# Patient Record
Sex: Female | Born: 1985 | Race: White | Hispanic: No
Health system: Southern US, Community
[De-identification: ages and names within clinical notes are randomized; demographics above are authoritative.]

## PROBLEM LIST (undated history)

## (undated) ENCOUNTER — Emergency Department (HOSPITAL_COMMUNITY): Admission: EM | Payer: Self-pay | Source: Home / Self Care

## (undated) DIAGNOSIS — R519 Headache, unspecified: Secondary | ICD-10-CM

## (undated) DIAGNOSIS — F419 Anxiety disorder, unspecified: Secondary | ICD-10-CM

## (undated) HISTORY — PX: NEPHRECTOMY: SHX65

---

## 2008-06-02 ENCOUNTER — Emergency Department (HOSPITAL_COMMUNITY): Admission: EM | Admit: 2008-06-02 | Discharge: 2008-06-02 | Payer: Self-pay | Admitting: Emergency Medicine

## 2011-01-27 ENCOUNTER — Emergency Department (HOSPITAL_COMMUNITY)
Admission: EM | Admit: 2011-01-27 | Discharge: 2011-01-27 | Disposition: A | Discharge status: Home / Self Care | Payer: Self-pay | Attending: Emergency Medicine | Admitting: Emergency Medicine

## 2011-01-27 DIAGNOSIS — K047 Periapical abscess without sinus: Secondary | ICD-10-CM | POA: Insufficient documentation

## 2011-02-13 ENCOUNTER — Emergency Department (HOSPITAL_COMMUNITY)
Admission: EM | Admit: 2011-02-13 | Discharge: 2011-02-13 | Disposition: A | Discharge status: Home / Self Care | Payer: Self-pay | Attending: Emergency Medicine | Admitting: Emergency Medicine

## 2011-02-13 DIAGNOSIS — H109 Unspecified conjunctivitis: Secondary | ICD-10-CM | POA: Insufficient documentation

## 2011-09-12 LAB — URINALYSIS, ROUTINE W REFLEX MICROSCOPIC
Leukocytes, UA: NEGATIVE
Nitrite: NEGATIVE
Urobilinogen, UA: 2 — ABNORMAL HIGH

## 2011-09-12 LAB — PREGNANCY, URINE: Preg Test, Ur: NEGATIVE

## 2011-10-27 ENCOUNTER — Emergency Department (HOSPITAL_COMMUNITY)
Admission: EM | Admit: 2011-10-27 | Discharge: 2011-10-27 | Disposition: A | Discharge status: Home / Self Care | Payer: Self-pay | Attending: Emergency Medicine | Admitting: Emergency Medicine

## 2011-10-27 ENCOUNTER — Emergency Department (HOSPITAL_COMMUNITY): Payer: Self-pay

## 2011-10-27 DIAGNOSIS — S29011A Strain of muscle and tendon of front wall of thorax, initial encounter: Secondary | ICD-10-CM

## 2011-10-27 DIAGNOSIS — R079 Chest pain, unspecified: Secondary | ICD-10-CM | POA: Insufficient documentation

## 2011-10-27 DIAGNOSIS — F17200 Nicotine dependence, unspecified, uncomplicated: Secondary | ICD-10-CM | POA: Insufficient documentation

## 2011-10-27 DIAGNOSIS — X58XXXA Exposure to other specified factors, initial encounter: Secondary | ICD-10-CM | POA: Insufficient documentation

## 2011-10-27 DIAGNOSIS — IMO0002 Reserved for concepts with insufficient information to code with codable children: Secondary | ICD-10-CM | POA: Insufficient documentation

## 2011-10-27 DIAGNOSIS — R059 Cough, unspecified: Secondary | ICD-10-CM | POA: Insufficient documentation

## 2011-10-27 DIAGNOSIS — R05 Cough: Secondary | ICD-10-CM | POA: Insufficient documentation

## 2011-10-27 MED ORDER — HYDROCODONE-ACETAMINOPHEN 5-325 MG PO TABS: 1.0000 | ORAL_TABLET | Freq: Four times a day (QID) | ORAL | Status: AC | PRN

## 2011-10-27 MED ORDER — NAPROXEN 500 MG PO TABS: 500.0000 mg | ORAL_TABLET | Freq: Two times a day (BID) | ORAL | Status: DC

## 2011-10-27 MED ORDER — HYDROCODONE-ACETAMINOPHEN 5-325 MG PO TABS
1.0000 | ORAL_TABLET | Freq: Once | ORAL | Status: AC
  Administered 2011-10-27: 1 via ORAL
  Filled 2011-10-27: qty 1

## 2011-10-27 NOTE — ED Provider Notes (Signed)
History     CSN: [id number] Arrival date & time: 10/27/2011 12:02 PM   First MD Initiated Contact with Patient 10/27/11 1256      Chief Complaint  Patient presents with  . Chest Pain    (Consider location/radiation/quality/duration/timing/severity/associated sxs/prior treatment) HPI Patient states she's been having pain in her right lower anterior ribs. Patient states it started on Sunday when she was at work and she had 2 sharp pains in that area. Patient states the pain has been more constant now. It increases with movement and palpation. She has not had any abdominal pain, vomiting, diarrhea, fever. Patient has not been on any long trips or travel. She has not noticed any leg swelling. She has not had problems like this before. She has been trying Tylenol without relief. History reviewed. No pertinent past medical history.  Past Surgical History  Procedure Date  . Nephrectomy living donor     No family history on file.  History  Substance Use Topics  . Smoking status: Current Everyday Smoker -- 0.5 packs/day  . Smokeless tobacco: Not on file  . Alcohol Use: No    OB History    Grav Para Term Preterm Abortions TAB SAB Ect Mult Living   2 2 2       2       Review of Systems  All other systems reviewed and are negative.    Allergies  Review of patient's allergies indicates no known allergies.  Home Medications  No current outpatient prescriptions on file.  BP 135/79  Pulse 111  Temp(Src) 97.9 F (36.6 C) (Oral)  Resp 16  Ht 5\' 3"  (1.6 m)  Wt 155 lb (70.308 kg)  BMI 27.46 kg/m2  SpO2 100%  LMP 10/17/2011  Physical Exam  Nursing note and vitals reviewed. Constitutional: She appears well-developed and well-nourished. No distress.  HENT:  Head: Normocephalic and atraumatic.  Right Ear: External ear normal.  Left Ear: External ear normal.  Eyes: Conjunctivae are normal. Right eye exhibits no discharge. Left eye exhibits no discharge. No scleral icterus.    Neck: Neck supple. No tracheal deviation present.  Cardiovascular: Normal rate, regular rhythm and intact distal pulses.   Pulmonary/Chest: Effort normal and breath sounds normal. No stridor. No respiratory distress. She has no wheezes. She has no rales. She exhibits tenderness.    Abdominal: Soft. Bowel sounds are normal. She exhibits no distension. There is no tenderness. There is no rebound and no guarding.  Musculoskeletal: She exhibits no edema and no tenderness.  Neurological: She is alert. She has normal strength. No sensory deficit. Cranial nerve deficit:  no gross defecits noted. She exhibits normal muscle tone. She displays no seizure activity. Coordination normal.  Skin: Skin is warm and dry. No rash noted.  Psychiatric: She has a normal mood and affect.    ED Course  Procedures (including critical care time)  Labs Reviewed - No data to display Dg Chest 2 View  10/27/2011  *RADIOLOGY REPORT*  Clinical Data: Right chest pain, cough  CHEST - 2 VIEW  Comparison: None.  Findings: Lungs are clear. No pleural effusion or pneumothorax.  Cardiomediastinal silhouette is within normal limits.  Visualized osseous structures are within normal limits.  IMPRESSION: Normal chest radiographs.  Original Report Authenticated By: Charline Bills, M.D.     Diagnosis: Chest wall strain    MDM  Patient's symptoms seem to be suggestive of chest wall pain. She has pain on palpation. It increases with deep breathing and movement. There is  no evidence of pneumonia or pneumothorax on her chest x-ray. Her symptoms are not suggestive of pulmonary embolism.        Celene Kras, MD 10/27/11 [phone]

## 2011-10-27 NOTE — ED Notes (Signed)
Pt presents with right lower rib pain. Pt states last Sunday she was at work and had 2 sharp pains to area and now pain has been constant.

## 2011-12-03 ENCOUNTER — Encounter (HOSPITAL_COMMUNITY): Payer: Self-pay | Admitting: *Deleted

## 2011-12-03 ENCOUNTER — Emergency Department (HOSPITAL_COMMUNITY)
Admission: EM | Admit: 2011-12-03 | Discharge: 2011-12-03 | Disposition: A | Discharge status: Home / Self Care | Payer: Self-pay | Attending: Emergency Medicine | Admitting: Emergency Medicine

## 2011-12-03 DIAGNOSIS — N390 Urinary tract infection, site not specified: Secondary | ICD-10-CM | POA: Insufficient documentation

## 2011-12-03 DIAGNOSIS — R112 Nausea with vomiting, unspecified: Secondary | ICD-10-CM | POA: Insufficient documentation

## 2011-12-03 DIAGNOSIS — F17200 Nicotine dependence, unspecified, uncomplicated: Secondary | ICD-10-CM | POA: Insufficient documentation

## 2011-12-03 LAB — URINE MICROSCOPIC-ADD ON

## 2011-12-03 LAB — POCT I-STAT, CHEM 8
BUN: 3 mg/dL — ABNORMAL LOW (ref 6–23)
Chloride: 104 mEq/L (ref 96–112)
Creatinine, Ser: 1 mg/dL (ref 0.50–1.10)
Glucose, Bld: 82 mg/dL (ref 70–99)
HCT: 40 % (ref 36.0–46.0)
Potassium: 3.8 mEq/L (ref 3.5–5.1)

## 2011-12-03 LAB — URINALYSIS, ROUTINE W REFLEX MICROSCOPIC
Bilirubin Urine: NEGATIVE
Glucose, UA: NEGATIVE mg/dL
Ketones, ur: NEGATIVE mg/dL
Specific Gravity, Urine: 1.02 (ref 1.005–1.030)
pH: 6 (ref 5.0–8.0)

## 2011-12-03 MED ORDER — NITROFURANTOIN MACROCRYSTAL 100 MG PO CAPS: 100.0000 mg | ORAL_CAPSULE | Freq: Two times a day (BID) | ORAL | Status: AC

## 2011-12-03 MED ORDER — SODIUM CHLORIDE 0.9 % IV BOLUS (SEPSIS)
1000.0000 mL | Freq: Once | INTRAVENOUS | Status: AC
  Administered 2011-12-03: 1000 mL via INTRAVENOUS

## 2011-12-03 MED ORDER — KETOROLAC TROMETHAMINE 30 MG/ML IJ SOLN
30.0000 mg | Freq: Once | INTRAMUSCULAR | Status: AC
  Administered 2011-12-03: 30 mg via INTRAVENOUS
  Filled 2011-12-03: qty 1

## 2011-12-03 MED ORDER — NITROFURANTOIN MACROCRYSTAL 100 MG PO CAPS
100.0000 mg | ORAL_CAPSULE | Freq: Once | ORAL | Status: AC
  Administered 2011-12-03: 100 mg via ORAL
  Filled 2011-12-03: qty 1

## 2011-12-03 MED ORDER — PROMETHAZINE HCL 25 MG PO TABS: 25.0000 mg | ORAL_TABLET | Freq: Four times a day (QID) | ORAL | Status: DC | PRN

## 2011-12-03 MED ORDER — ONDANSETRON HCL 4 MG/2ML IJ SOLN
4.0000 mg | Freq: Once | INTRAMUSCULAR | Status: AC
  Administered 2011-12-03: 4 mg via INTRAVENOUS
  Filled 2011-12-03: qty 2

## 2011-12-03 NOTE — ED Notes (Signed)
Pt tolerating p.o intake with no difficulty.  IV fluid infused.  Reporting relief from nausea at this time.

## 2011-12-03 NOTE — ED Notes (Signed)
Pt c/o vomiting and diarrhea since Sunday. Pt states that she is able to keep fluids down. Denies fever.

## 2011-12-03 NOTE — ED Notes (Signed)
Patient would just like to know how much longer before a doctor will be in due to a bad headache.

## 2011-12-03 NOTE — ED Notes (Signed)
Pt reporting nausea, vomiting and diarrhea since Sunday.  Reports chills, body aches, and headache.  Denies fever, but also states that she hasn't been checking it either.  No distress noted at present time.

## 2011-12-03 NOTE — ED Notes (Signed)
Assumed care for discharge.  Prescriptions given for Macrodantin and Phenergan; effects and use explained.  Patient verbalized understanding of sedating effects of Phenergan and to complete entire prescription of Macrodantin.  Patient ambulatory; discharged home in good condition.

## 2011-12-03 NOTE — ED Provider Notes (Signed)
History   Scribed for [employer]. Colon Branch, MD, the patient was seen in room APA08/APA08 . This chart was scribed by Lewanda Rife.   CSN: [id number] Arrival date & time: 12/03/2011  6:31 PM   First MD Initiated Contact with Patient 12/03/11 1938      Chief Complaint  Patient presents with  . Emesis    (Consider location/radiation/quality/duration/timing/severity/associated sxs/prior treatment) HPI Brittany [name] is a 25 y.o. female who presents to the Emergency Department complaining vomiting associated with diarrhea for the past 3 days. Pt reports she has not been able to keep food down for 3 days until today when she was able to keep down small sips of fluids. Illness associated with chills and myalgias. She has taken no medicines. Denies fever, chest pain, cough, shortness of breath.   .History reviewed. No pertinent past medical history.  Past Surgical History  Procedure Date  . Nephrectomy living donor     History reviewed. No pertinent family history.  History  Substance Use Topics  . Smoking status: Current Everyday Smoker -- 0.5 packs/day  . Smokeless tobacco: Not on file  . Alcohol Use: No    OB History    Grav Para Term Preterm Abortions TAB SAB Ect Mult Living   2 2 2       2       Review of Systems  Constitutional: Negative for fever.  Gastrointestinal: Positive for vomiting and diarrhea. Negative for nausea.  All other systems reviewed and are negative.    Allergies  Review of patient's allergies indicates no known allergies.  Home Medications  No current outpatient prescriptions on file.  Triage VS: BP 106/65  Pulse 91  Temp(Src) 98 F (36.7 C) (Oral)  Resp 16  Ht 5\' 3"  (1.6 m)  Wt 150 lb (68.04 kg)  BMI 26.57 kg/m2  SpO2 100%  LMP 10/16/2011  Physical Exam  Nursing note and vitals reviewed. Constitutional: She is oriented to person, place, and time. She appears well-developed and well-nourished.  HENT:  Head: Normocephalic and  atraumatic.  Right Ear: External ear normal.  Left Ear: External ear normal.  Mouth/Throat: Oropharynx is clear and moist.  Eyes: EOM are normal. Pupils are equal, round, and reactive to light.  Neck: Normal range of motion. Neck supple.  Cardiovascular: Normal rate and regular rhythm.  Exam reveals no gallop and no friction rub.   No murmur heard. Pulmonary/Chest: Effort normal and breath sounds normal.  Abdominal: Soft. Bowel sounds are normal.  Musculoskeletal: Normal range of motion.  Neurological: She is alert and oriented to person, place, and time.  Skin: Skin is warm and dry.  Psychiatric: She has a normal mood and affect. Her behavior is normal.    ED Course  Procedures (including critical care time)  10:37 PM Discussed with pt lab results and plan to d/c. Will be prescribing antibiotics for possible UTI.   Results for orders placed during the hospital encounter of 12/03/11  URINALYSIS, ROUTINE W REFLEX MICROSCOPIC      Component Value Range   Color, Urine YELLOW  YELLOW    APPearance HAZY (*) CLEAR    Specific Gravity, Urine 1.020  1.005 - 1.030    pH 6.0  5.0 - 8.0    Glucose, UA NEGATIVE  NEGATIVE (mg/dL)   Hgb urine dipstick TRACE (*) NEGATIVE    Bilirubin Urine NEGATIVE  NEGATIVE    Ketones, ur NEGATIVE  NEGATIVE (mg/dL)   Protein, ur NEGATIVE  NEGATIVE (mg/dL)   Urobilinogen,  UA 0.2  0.0 - 1.0 (mg/dL)   Nitrite POSITIVE (*) NEGATIVE    Leukocytes, UA SMALL (*) NEGATIVE   POCT I-STAT, CHEM 8      Component Value Range   Sodium 142  135 - 145 (mEq/L)   Potassium 3.8  3.5 - 5.1 (mEq/L)   Chloride 104  96 - 112 (mEq/L)   BUN 3 (*) 6 - 23 (mg/dL)   Creatinine, Ser 1.61  0.50 - 1.10 (mg/dL)   Glucose, Bld 82  70 - 99 (mg/dL)   Calcium, Ion 0.96  0.45 - 1.32 (mmol/L)   TCO2 27  0 - 100 (mmol/L)   Hemoglobin 13.6  12.0 - 15.0 (g/dL)   HCT 40.9  81.1 - 91.4 (%)  URINE MICROSCOPIC-ADD ON      Component Value Range   Squamous Epithelial / LPF FEW (*) RARE     WBC, UA 11-20  <3 (WBC/hpf)   RBC / HPF 3-6  <3 (RBC/hpf)   Bacteria, UA MANY (*) RARE    New Prescriptions   NITROFURANTOIN (MACRODANTIN) 100 MG CAPSULE    Take 1 capsule (100 mg total) by mouth 2 (two) times daily.   PROMETHAZINE (PHENERGAN) 25 MG TABLET    Take 1 tablet (25 mg total) by mouth every 6 (six) hours as needed for nausea.        MDM  Patient with three day history of N,V chills and myalgias. Labs unremarkable except for urinary tract infection. Received IVFs, antiinflammatory, antiemetic, analgesic with relief. Able to take PO fluids. Initiated antibiotic therapy. Pt feels improved after observation and/or treatment in ED.Pt stable in ED with no significant deterioration in condition.The patient appears reasonably screened and/or stabilized for discharge and I doubt any other medical condition or other [facility] requiring further screening, evaluation, or treatment in the ED at this time prior to discharge.  I personally performed the services described in this documentation, which was scribed in my presence. The recorded information has been reviewed and considered.  MDM Reviewed: nursing note and vitals Interpretation: labs          Nicoletta Dress. Colon Branch, MD 12/04/11 1302

## 2011-12-16 ENCOUNTER — Encounter (HOSPITAL_COMMUNITY): Payer: Self-pay

## 2011-12-16 ENCOUNTER — Emergency Department (HOSPITAL_COMMUNITY)
Admission: EM | Admit: 2011-12-16 | Discharge: 2011-12-16 | Disposition: A | Discharge status: Home / Self Care | Payer: Self-pay | Attending: Emergency Medicine | Admitting: Emergency Medicine

## 2011-12-16 DIAGNOSIS — K029 Dental caries, unspecified: Secondary | ICD-10-CM | POA: Insufficient documentation

## 2011-12-16 DIAGNOSIS — R220 Localized swelling, mass and lump, head: Secondary | ICD-10-CM | POA: Insufficient documentation

## 2011-12-16 DIAGNOSIS — F17200 Nicotine dependence, unspecified, uncomplicated: Secondary | ICD-10-CM | POA: Insufficient documentation

## 2011-12-16 DIAGNOSIS — R6884 Jaw pain: Secondary | ICD-10-CM | POA: Insufficient documentation

## 2011-12-16 DIAGNOSIS — R000 Tachycardia, unspecified: Secondary | ICD-10-CM | POA: Insufficient documentation

## 2011-12-16 DIAGNOSIS — Z79899 Other long term (current) drug therapy: Secondary | ICD-10-CM | POA: Insufficient documentation

## 2011-12-16 DIAGNOSIS — K089 Disorder of teeth and supporting structures, unspecified: Secondary | ICD-10-CM | POA: Insufficient documentation

## 2011-12-16 DIAGNOSIS — H9209 Otalgia, unspecified ear: Secondary | ICD-10-CM | POA: Insufficient documentation

## 2011-12-16 DIAGNOSIS — R221 Localized swelling, mass and lump, neck: Secondary | ICD-10-CM | POA: Insufficient documentation

## 2011-12-16 DIAGNOSIS — K047 Periapical abscess without sinus: Secondary | ICD-10-CM | POA: Insufficient documentation

## 2011-12-16 MED ORDER — HYDROCODONE-ACETAMINOPHEN 5-325 MG PO TABS: 1.0000 | ORAL_TABLET | ORAL | Status: AC | PRN

## 2011-12-16 MED ORDER — OXYCODONE-ACETAMINOPHEN 5-325 MG PO TABS
2.0000 | ORAL_TABLET | Freq: Once | ORAL | Status: AC
  Administered 2011-12-16: 2 via ORAL
  Filled 2011-12-16: qty 2

## 2011-12-16 MED ORDER — AMOXICILLIN 500 MG PO CAPS: 500.0000 mg | ORAL_CAPSULE | Freq: Three times a day (TID) | ORAL | Status: AC

## 2011-12-16 MED ORDER — AMOXICILLIN 250 MG PO CAPS
500.0000 mg | ORAL_CAPSULE | Freq: Once | ORAL | Status: AC
  Administered 2011-12-16: 500 mg via ORAL
  Filled 2011-12-16: qty 2

## 2011-12-16 NOTE — ED Notes (Signed)
Patient with no complaints at this time. Respirations even and unlabored. Skin warm/dry. Discharge instructions reviewed with patient at this time. Patient given opportunity to voice concerns/ask questions. Patient discharged at this time and left Emergency Department with steady gait.   

## 2011-12-16 NOTE — ED Provider Notes (Signed)
History     CSN: [id number]  Arrival date & time 12/16/11  1556   First MD Initiated Contact with Patient 12/16/11 1727      Chief Complaint  Patient presents with  . Dental Pain    (Consider location/radiation/quality/duration/timing/severity/associated sxs/prior treatment) Patient is a 25 y.o. female presenting with tooth pain. The history is provided by the patient.  Dental PainThe primary symptoms include mouth pain. Primary symptoms do not include headaches, fever, shortness of breath or sore throat. Primary symptoms comment: Patient with chronic poor dentition and multiple caries has had increased pain in right upper 3rd molar tooth for the past 3 days.  Additional symptoms include: gum swelling, gum tenderness, jaw pain and ear pain. Additional symptoms do not include: dental sensitivity to temperature, facial swelling, trouble swallowing, taste disturbance and swollen glands.    History reviewed. No pertinent past medical history.  Past Surgical History  Procedure Date  . Nephrectomy living donor     No family history on file.  History  Substance Use Topics  . Smoking status: Current Everyday Smoker -- 0.5 packs/day  . Smokeless tobacco: Not on file  . Alcohol Use: No    OB History    Grav Para Term Preterm Abortions TAB SAB Ect Mult Living   2 2 2       2       Review of Systems  Constitutional: Negative for fever.  HENT: Positive for ear pain and dental problem. Negative for congestion, sore throat, facial swelling, trouble swallowing and neck pain.   Eyes: Negative.   Respiratory: Negative for chest tightness and shortness of breath.   Cardiovascular: Negative for chest pain.  Gastrointestinal: Negative for nausea and abdominal pain.  Genitourinary: Negative.   Musculoskeletal: Negative for joint swelling and arthralgias.  Skin: Negative.  Negative for rash and wound.  Neurological: Negative for dizziness, weakness, light-headedness, numbness and  headaches.  Hematological: Negative.   Psychiatric/Behavioral: Negative.     Allergies  Review of patient's allergies indicates no known allergies.  Home Medications   Current Outpatient Rx  Name Route Sig Dispense Refill  . ACETAMINOPHEN 500 MG PO TABS Oral Take 1,000-1,500 mg by mouth 2 (two) times daily as needed. For pain     . AMOXICILLIN 500 MG PO CAPS Oral Take 1 capsule (500 mg total) by mouth 3 (three) times daily. 30 capsule 0  . HYDROCODONE-ACETAMINOPHEN 5-325 MG PO TABS Oral Take 1 tablet by mouth every 4 (four) hours as needed for pain. 15 tablet 0    BP 134/90  Pulse 94  Temp(Src) 97.7 F (36.5 C) (Oral)  Resp 17  Ht 5\' 3"  (1.6 m)  Wt 150 lb (68.04 kg)  BMI 26.57 kg/m2  SpO2 100%  LMP 10/01/2011  Physical Exam  Nursing note and vitals reviewed. Constitutional: She is oriented to person, place, and time. She appears well-developed and well-nourished.  HENT:  Head: Normocephalic and atraumatic.  Right Ear: External ear normal.  Left Ear: External ear normal.  Nose: Nose normal.  Mouth/Throat: Uvula is midline, oropharynx is clear and moist and mucous membranes are normal.         Generalized poor dentition.  Eyes: Conjunctivae are normal.  Neck: Normal range of motion.  Cardiovascular: Intact distal pulses.  Tachycardia present.   Pulmonary/Chest: Effort normal. No respiratory distress.  Musculoskeletal: Normal range of motion.  Neurological: She is alert and oriented to person, place, and time.  Skin: Skin is warm and dry.  Psychiatric: She  has a normal mood and affect.    ED Course  Procedures (including critical care time)  Labs Reviewed - No data to display No results found.   1. Dental abscess   2. Dental caries     Recheck of pulse after pain medicine given (pain improved) - no longer tachycardic    MDM  Dental referral numbers given.  Amoxil,  Hydrocodone.        Candis Musa, PA 12/16/11 [phone]

## 2011-12-16 NOTE — ED Notes (Signed)
Pt presents with upper right sided dental pain x 3 days.

## 2011-12-18 NOTE — ED Provider Notes (Signed)
Medical screening examination/treatment/procedure(s) were performed by non-physician practitioner and as supervising physician I was immediately available for consultation/collaboration.  Ardys Hataway S. Robina Hamor, MD 12/18/11 1345 

## 2012-01-21 ENCOUNTER — Emergency Department (HOSPITAL_COMMUNITY)
Admission: EM | Admit: 2012-01-21 | Discharge: 2012-01-21 | Disposition: A | Discharge status: Home / Self Care | Payer: Self-pay | Attending: Emergency Medicine | Admitting: Emergency Medicine

## 2012-01-21 ENCOUNTER — Encounter (HOSPITAL_COMMUNITY): Payer: Self-pay | Admitting: *Deleted

## 2012-01-21 DIAGNOSIS — K029 Dental caries, unspecified: Secondary | ICD-10-CM | POA: Insufficient documentation

## 2012-01-21 MED ORDER — AMOXICILLIN 500 MG PO CAPS: ORAL_CAPSULE | ORAL | Status: DC

## 2012-01-21 MED ORDER — HYDROCODONE-ACETAMINOPHEN 5-325 MG PO TABS: 1.0000 | ORAL_TABLET | ORAL | Status: AC | PRN

## 2012-01-21 NOTE — ED Notes (Signed)
Toothache to upper left side that started yesterday, pt states that she has a tooth that has broken and began to start hurting,

## 2012-01-21 NOTE — ED Provider Notes (Signed)
History     CSN: [id number]  Arrival date & time 01/21/12  1438   First MD Initiated Contact with Patient 01/21/12 1442      Chief Complaint  Patient presents with  . Dental Pain    (Consider location/radiation/quality/duration/timing/severity/associated sxs/prior treatment) Patient is a 26 y.o. female presenting with tooth pain. The history is provided by the patient.  Dental PainThe primary symptoms include mouth pain and headaches. Primary symptoms do not include shortness of breath or cough. The symptoms began yesterday. The symptoms are unchanged. The symptoms occur constantly.  The headache is not associated with photophobia.  Additional symptoms include: dental sensitivity to temperature, gum swelling, gum tenderness, jaw pain and facial swelling. Additional symptoms do not include: trouble swallowing and nosebleeds. Medical issues include: smoking.    History reviewed. No pertinent past medical history.  Past Surgical History  Procedure Date  . Nephrectomy living donor     No family history on file.  History  Substance Use Topics  . Smoking status: Current Everyday Smoker -- 0.5 packs/day  . Smokeless tobacco: Not on file  . Alcohol Use: No    OB History    Grav Para Term Preterm Abortions TAB SAB Ect Mult Living   2 2 2       2       Review of Systems  Constitutional: Negative for activity change.       All ROS Neg except as noted in HPI  HENT: Positive for facial swelling. Negative for nosebleeds, trouble swallowing and neck pain.   Eyes: Negative for photophobia and discharge.  Respiratory: Negative for cough, shortness of breath and wheezing.   Cardiovascular: Negative for chest pain and palpitations.  Gastrointestinal: Negative for abdominal pain and blood in stool.  Genitourinary: Negative for dysuria, frequency and hematuria.  Musculoskeletal: Negative for back pain and arthralgias.  Skin: Negative.   Neurological: Positive for headaches. Negative  for dizziness, seizures and speech difficulty.  Psychiatric/Behavioral: Negative for hallucinations and confusion.    Allergies  Ibuprofen  Home Medications   Current Outpatient Rx  Name Route Sig Dispense Refill  . ACETAMINOPHEN 500 MG PO TABS Oral Take 1,000-1,500 mg by mouth 2 (two) times daily as needed. For pain       BP 111/74  Pulse 100  Temp(Src) 98.4 F (36.9 C) (Oral)  Resp 20  Ht 5\' 3"  (1.6 m)  Wt 154 lb (69.854 kg)  BMI 27.28 kg/m2  SpO2 99%  LMP 01/12/2012  Physical Exam  Nursing note and vitals reviewed. Constitutional: She is oriented to person, place, and time. She appears well-developed and well-nourished.  Non-toxic appearance.  HENT:  Head: Normocephalic.  Right Ear: Tympanic membrane and external ear normal.  Left Ear: Tympanic membrane and external ear normal.       Multiple dental caries, left greater than right. Gum swelling of the right upper gum area.  Airway patent.  Eyes: EOM and lids are normal. Pupils are equal, round, and reactive to light.  Neck: Normal range of motion. Neck supple. Carotid bruit is not present.  Cardiovascular: Regular rhythm, normal heart sounds, intact distal pulses and normal pulses.  Tachycardia present.   Pulmonary/Chest: Breath sounds normal. No respiratory distress.  Abdominal: Soft. Bowel sounds are normal. There is no tenderness. There is no guarding.  Musculoskeletal: Normal range of motion.  Lymphadenopathy:       Head (right side): No submandibular adenopathy present.       Head (left side): No submandibular adenopathy  present.    She has no cervical adenopathy.  Neurological: She is alert and oriented to person, place, and time. She has normal strength. No cranial nerve deficit or sensory deficit.  Skin: Skin is warm and dry.  Psychiatric: She has a normal mood and affect. Her speech is normal.    ED Course  Procedures (including critical care time)  Labs Reviewed - No data to display No results  found.   Dx: Multiple dental caries   MDM  I have reviewed nursing notes, vital signs, and all appropriate lab and imaging results for this patient. Patient complains of toothache of the left upper side of the jaw since February for. Patient has had multiple bouts this toothache pain and dental caries. Patient treated with amoxicillin 500 mg, Norco 5mg , meloxicam 7.5 mg. Patient states she should be getting her dental insurance in April and plans to arrange an appointment at that time.       Kathie Dike, [place] 01/21/12 [phone]

## 2012-01-21 NOTE — ED Notes (Signed)
See triage note.

## 2012-01-22 NOTE — ED Provider Notes (Signed)
Medical screening examination/treatment/procedure(s) were performed by non-physician practitioner and as supervising physician I was immediately available for consultation/collaboration.   Arlyce Circle M Ryleigh Esqueda, DO 01/22/12 1759 

## 2012-03-10 ENCOUNTER — Emergency Department (HOSPITAL_COMMUNITY)
Admission: EM | Admit: 2012-03-10 | Discharge: 2012-03-10 | Disposition: A | Discharge status: Home / Self Care | Payer: Self-pay | Attending: Emergency Medicine | Admitting: Emergency Medicine

## 2012-03-10 ENCOUNTER — Encounter (HOSPITAL_COMMUNITY): Payer: Self-pay | Admitting: *Deleted

## 2012-03-10 DIAGNOSIS — K0889 Other specified disorders of teeth and supporting structures: Secondary | ICD-10-CM

## 2012-03-10 DIAGNOSIS — F17200 Nicotine dependence, unspecified, uncomplicated: Secondary | ICD-10-CM | POA: Insufficient documentation

## 2012-03-10 DIAGNOSIS — K047 Periapical abscess without sinus: Secondary | ICD-10-CM | POA: Insufficient documentation

## 2012-03-10 MED ORDER — PENICILLIN V POTASSIUM 500 MG PO TABS: 500.0000 mg | ORAL_TABLET | Freq: Four times a day (QID) | ORAL | Status: AC

## 2012-03-10 MED ORDER — OXYCODONE-ACETAMINOPHEN 5-325 MG PO TABS
2.0000 | ORAL_TABLET | Freq: Once | ORAL | Status: AC
  Administered 2012-03-10: 2 via ORAL
  Filled 2012-03-10: qty 2

## 2012-03-10 MED ORDER — OXYCODONE-ACETAMINOPHEN 5-325 MG PO TABS: 1.0000 | ORAL_TABLET | Freq: Four times a day (QID) | ORAL | Status: AC | PRN

## 2012-03-10 NOTE — Discharge Instructions (Signed)
Follow up with a dentist

## 2012-03-10 NOTE — ED Notes (Addendum)
Pt reporting toothache on left side.  States she does have a broken tooth that she is aware of, and pain has increased over last several days.

## 2012-03-12 NOTE — ED Provider Notes (Cosign Needed)
History     CSN: [id number]  Arrival date & time 03/10/12  0231   First MD Initiated Contact with Patient 03/10/12 0245      Chief Complaint  Patient presents with  . Dental Pain    (Consider location/radiation/quality/duration/timing/severity/associated sxs/prior treatment) Patient is a 26 y.o. female presenting with tooth pain. The history is provided by the patient (pt complains of toothache). No language interpreter was used.  Dental PainThe primary symptoms include mouth pain. Primary symptoms do not include headaches or cough. The symptoms began 6 to 12 hours ago. The symptoms are unchanged. The symptoms are recurrent. The symptoms occur constantly.  Additional symptoms do not include: ear pain and fatigue. Medical issues do not include: alcohol problem.    History reviewed. No pertinent past medical history.  Past Surgical History  Procedure Date  . Nephrectomy living donor     History reviewed. No pertinent family history.  History  Substance Use Topics  . Smoking status: Current Everyday Smoker -- 0.5 packs/day  . Smokeless tobacco: Not on file  . Alcohol Use: No    OB History    Grav Para Term Preterm Abortions TAB SAB Ect Mult Living   2 2 2       2       Review of Systems  Constitutional: Negative for fatigue.  HENT: Negative for ear pain, congestion, sinus pressure and ear discharge.        Toothache  Eyes: Negative for discharge.  Respiratory: Negative for cough.   Cardiovascular: Negative for chest pain.  Gastrointestinal: Negative for abdominal pain and diarrhea.  Genitourinary: Negative for frequency and hematuria.  Musculoskeletal: Negative for back pain.  Skin: Negative for rash.  Neurological: Negative for seizures and headaches.  Hematological: Negative.   Psychiatric/Behavioral: Negative for hallucinations.    Allergies  Ibuprofen  Home Medications   Current Outpatient Rx  Name Route Sig Dispense Refill  . ACETAMINOPHEN 500 MG PO  TABS Oral Take 1,000-1,500 mg by mouth 2 (two) times daily as needed. For pain     . AMOXICILLIN 500 MG PO CAPS  2 tablets by mouth twice a day with food 28 capsule 0  . OXYCODONE-ACETAMINOPHEN 5-325 MG PO TABS Oral Take 1 tablet by mouth every 6 (six) hours as needed for pain. 30 tablet 0  . PENICILLIN V POTASSIUM 500 MG PO TABS Oral Take 1 tablet (500 mg total) by mouth 4 (four) times daily. 40 tablet 0    BP 113/63  Pulse 90  Temp 98.2 F (36.8 C)  Resp 18  Ht 5\' 3"  (1.6 m)  Wt 155 lb (70.308 kg)  BMI 27.46 kg/m2  SpO2 100%  Physical Exam  Constitutional: She is oriented to person, place, and time. She appears well-developed.  HENT:  Head: Normocephalic and atraumatic.       Tender right lower molar  Eyes: Conjunctivae and EOM are normal. No scleral icterus.  Neck: Neck supple. No thyromegaly present.  Cardiovascular: Normal rate and regular rhythm.  Exam reveals no gallop and no friction rub.   No murmur heard. Pulmonary/Chest: No stridor. She has no wheezes. She has no rales. She exhibits no tenderness.  Abdominal: She exhibits no distension. There is no tenderness. There is no rebound.  Musculoskeletal: Normal range of motion. She exhibits no edema.  Lymphadenopathy:    She has no cervical adenopathy.  Neurological: She is oriented to person, place, and time. Coordination normal.  Skin: No rash noted. No erythema.  Psychiatric: She  has a normal mood and affect. Her behavior is normal.    ED Course  Procedures (including critical care time)  Labs Reviewed - No data to display No results found.   1. Toothache       MDM  Abscess tooth.  Pt to follow up with dentist        Benny Lennert, MD 03/12/12 [phone]

## 2012-04-05 ENCOUNTER — Encounter (HOSPITAL_COMMUNITY): Payer: Self-pay

## 2012-04-05 ENCOUNTER — Emergency Department (HOSPITAL_COMMUNITY)
Admission: EM | Admit: 2012-04-05 | Discharge: 2012-04-05 | Disposition: A | Discharge status: Home / Self Care | Payer: PRIVATE HEALTH INSURANCE | Attending: Emergency Medicine | Admitting: Emergency Medicine

## 2012-04-05 DIAGNOSIS — R1011 Right upper quadrant pain: Secondary | ICD-10-CM | POA: Insufficient documentation

## 2012-04-05 DIAGNOSIS — F17200 Nicotine dependence, unspecified, uncomplicated: Secondary | ICD-10-CM | POA: Insufficient documentation

## 2012-04-05 DIAGNOSIS — R112 Nausea with vomiting, unspecified: Secondary | ICD-10-CM | POA: Insufficient documentation

## 2012-04-05 DIAGNOSIS — M549 Dorsalgia, unspecified: Secondary | ICD-10-CM | POA: Insufficient documentation

## 2012-04-05 LAB — CBC
MCH: 30.5 pg (ref 26.0–34.0)
MCHC: 34.3 g/dL (ref 30.0–36.0)
RDW: 13 % (ref 11.5–15.5)

## 2012-04-05 LAB — COMPREHENSIVE METABOLIC PANEL
AST: 14 U/L (ref 0–37)
Albumin: 4 g/dL (ref 3.5–5.2)
BUN: 5 mg/dL — ABNORMAL LOW (ref 6–23)
Chloride: 95 mEq/L — ABNORMAL LOW (ref 96–112)
Creatinine, Ser: 0.75 mg/dL (ref 0.50–1.10)
Potassium: 3.4 mEq/L — ABNORMAL LOW (ref 3.5–5.1)
Total Bilirubin: 0.3 mg/dL (ref 0.3–1.2)
Total Protein: 7.1 g/dL (ref 6.0–8.3)

## 2012-04-05 LAB — URINALYSIS, ROUTINE W REFLEX MICROSCOPIC
Bilirubin Urine: NEGATIVE
Nitrite: NEGATIVE
Specific Gravity, Urine: 1.01 (ref 1.005–1.030)
Urobilinogen, UA: 0.2 mg/dL (ref 0.0–1.0)

## 2012-04-05 LAB — URINE MICROSCOPIC-ADD ON

## 2012-04-05 LAB — LIPASE, BLOOD: Lipase: 28 U/L (ref 11–59)

## 2012-04-05 LAB — DIFFERENTIAL
Basophils Absolute: 0 10*3/uL (ref 0.0–0.1)
Basophils Relative: 0 % (ref 0–1)
Eosinophils Absolute: 0.2 10*3/uL (ref 0.0–0.7)
Neutro Abs: 5.7 10*3/uL (ref 1.7–7.7)
Neutrophils Relative %: 67 % (ref 43–77)

## 2012-04-05 MED ORDER — HYDROMORPHONE HCL PF 1 MG/ML IJ SOLN
1.0000 mg | Freq: Once | INTRAMUSCULAR | Status: AC
  Administered 2012-04-05: 1 mg via INTRAVENOUS
  Filled 2012-04-05: qty 1

## 2012-04-05 MED ORDER — SODIUM CHLORIDE 0.9 % IV BOLUS (SEPSIS)
1000.0000 mL | Freq: Once | INTRAVENOUS | Status: AC
  Administered 2012-04-05: 1000 mL via INTRAVENOUS

## 2012-04-05 MED ORDER — ONDANSETRON HCL 4 MG/2ML IJ SOLN
4.0000 mg | Freq: Once | INTRAMUSCULAR | Status: AC
  Administered 2012-04-05: 4 mg via INTRAVENOUS
  Filled 2012-04-05: qty 2

## 2012-04-05 MED ORDER — HYDROCODONE-ACETAMINOPHEN 5-325 MG PO TABS: 1.0000 | ORAL_TABLET | ORAL | Status: AC | PRN

## 2012-04-05 MED ORDER — PROMETHAZINE HCL 25 MG PO TABS: 25.0000 mg | ORAL_TABLET | Freq: Four times a day (QID) | ORAL | Status: DC | PRN

## 2012-04-05 NOTE — Discharge Instructions (Signed)
Abdominal Pain Abdominal pain can be caused by many things. Your caregiver decides the seriousness of your pain by an examination and possibly blood tests and X-rays. Many cases can be observed and treated at home. Most abdominal pain is not caused by a disease and will probably improve without treatment. However, in many cases, more time must pass before a clear cause of the pain can be found. Before that point, it may not be known if you need more testing, or if hospitalization or surgery is needed. HOME CARE INSTRUCTIONS   Do not take laxatives unless directed by your caregiver.   Take pain medicine only as directed by your caregiver.   Only take over-the-counter or prescription medicines for pain, discomfort, or fever as directed by your caregiver.   Try a clear liquid diet (broth, tea, or water) for as long as directed by your caregiver. Slowly move to a bland diet as tolerated.  SEEK IMMEDIATE MEDICAL CARE IF:   The pain does not go away.   You have a fever.   You keep throwing up (vomiting).   The pain is felt only in portions of the abdomen. Pain in the right side could possibly be appendicitis. In an adult, pain in the left lower portion of the abdomen could be colitis or diverticulitis.   You pass bloody or black tarry stools.  MAKE SURE YOU:   Understand these instructions.   Will watch your condition.   Will get help right away if you are not doing well or get worse.  Document Released: 09/11/2005 Document Revised: 11/21/2011 Document Reviewed: 07/20/2008 [facility] Patient Information 2012 [place], [place].  Return for ultrasound of her gallbladder at 1045 morning. Medication for pain and nausea. Will need followup your pregnancy.

## 2012-04-05 NOTE — ED Provider Notes (Signed)
History     CSN: [id number]  Arrival date & time 04/05/12  Brittany [name]   First MD Initiated Contact with Patient 04/05/12 1926      Chief Complaint  Patient presents with  . Emesis  . Back Pain    (Consider location/radiation/quality/duration/timing/severity/associated sxs/prior treatment) Patient is a 26 y.o. female presenting with vomiting and back pain.  Emesis   Back Pain   ...  right upper quadrant pain for approximately one week associated with eating.  Also complains of nausea and vomiting. Pain radiates to back. Food makes it worse. Nothing makes it better. Pain is sharp and crampy.  Mother had cholecystectomy at age 34  History reviewed. No pertinent past medical history.  Past Surgical History  Procedure Date  . Nephrectomy living donor     No family history on file.  History  Substance Use Topics  . Smoking status: Current Everyday Smoker -- 0.5 packs/day    Types: Cigarettes  . Smokeless tobacco: Not on file  . Alcohol Use: No    OB History    Grav Para Term Preterm Abortions TAB SAB Ect Mult Living   2 2 2       2       Review of Systems  Gastrointestinal: Positive for vomiting.  Musculoskeletal: Positive for back pain.  All other systems reviewed and are negative.    Allergies  Ibuprofen  Home Medications   Current Outpatient Rx  Name Route Sig Dispense Refill  . ACETAMINOPHEN 500 MG PO TABS Oral Take 1,000-1,500 mg by mouth 2 (two) times daily as needed. For pain     . PROMETHAZINE HCL 25 MG PO TABS Oral Take 25 mg by mouth every 6 (six) hours as needed. nausea    . HYDROCODONE-ACETAMINOPHEN 5-325 MG PO TABS Oral Take 1-2 tablets by mouth every 4 (four) hours as needed for pain. 20 tablet 0  . PROMETHAZINE HCL 25 MG PO TABS Oral Take 1 tablet (25 mg total) by mouth every 6 (six) hours as needed for nausea. 20 tablet 0  . PROMETHAZINE HCL 25 MG PO TABS Oral Take 1 tablet (25 mg total) by mouth every 6 (six) hours as needed for nausea. 20 tablet 0      BP 109/52  Pulse 72  Temp(Src) 98.2 F (36.8 C) (Oral)  Resp 20  Ht 5\' 3"  (1.6 m)  Wt 155 lb (70.308 kg)  BMI 27.46 kg/m2  SpO2 98%  LMP 03/15/2012  Physical Exam  Nursing note and vitals reviewed. Constitutional: She is oriented to person, place, and time. She appears well-developed and well-nourished.  HENT:  Head: Normocephalic and atraumatic.  Eyes: Conjunctivae and EOM are normal. Pupils are equal, round, and reactive to light.  Neck: Normal range of motion. Neck supple.  Cardiovascular: Normal rate and regular rhythm.   Pulmonary/Chest: Effort normal and breath sounds normal.  Abdominal: Soft. Bowel sounds are normal.       Tender right upper quadrant  Musculoskeletal: Normal range of motion.  Neurological: She is alert and oriented to person, place, and time.  Skin: Skin is warm and dry.  Psychiatric: She has a normal mood and affect.    ED Course  Procedures (including critical care time)  Labs Reviewed  URINALYSIS, ROUTINE W REFLEX MICROSCOPIC - Abnormal; Notable for the following:    Leukocytes, UA TRACE (*)    All other components within normal limits  PREGNANCY, URINE - Abnormal; Notable for the following:    Preg Test, Ur POSITIVE (*)  All other components within normal limits  URINE MICROSCOPIC-ADD ON - Abnormal; Notable for the following:    Squamous Epithelial / LPF FEW (*)    Bacteria, UA FEW (*)    All other components within normal limits  COMPREHENSIVE METABOLIC PANEL - Abnormal; Notable for the following:    Sodium 131 (*)    Potassium 3.4 (*)    Chloride 95 (*)    BUN 5 (*)    All other components within normal limits  CBC  DIFFERENTIAL  LIPASE, BLOOD   No results found.   1. Abdominal pain       MDM  History and physical consistent with gallbladder attack. Will treat pain. No elevation of liver function. White count normal. Ultrasound in morning. Discussed positive pregnancy test with patient and mother        Donnetta Hutching, MD 04/05/12 2215

## 2012-04-05 NOTE — ED Notes (Signed)
Pt having nausea and vomiting for 1 week, right side ab pain and low back pain

## 2012-04-05 NOTE — ED Notes (Addendum)
Pt reports nausea & vomiting for the past week. Gets sick after eating & then can eat again & that stays down but w/ increase abdominal pain in the right upper quadrant.

## 2012-04-05 NOTE — ED Notes (Signed)
Pt alert & oriented x4, stable gait. Pt given discharge instructions, paperwork & prescription(s). Patient instructed to stop at the registration desk to finish any additional paperwork. pt verbalized understanding. Pt left department w/ no further questions.  

## 2012-04-06 ENCOUNTER — Other Ambulatory Visit (HOSPITAL_COMMUNITY): Payer: Self-pay | Admitting: Emergency Medicine

## 2012-04-06 ENCOUNTER — Ambulatory Visit (HOSPITAL_COMMUNITY)
Admit: 2012-04-06 | Discharge: 2012-04-06 | Disposition: A | Discharge status: Home / Self Care | Payer: PRIVATE HEALTH INSURANCE | Source: Ambulatory Visit | Attending: Emergency Medicine | Admitting: Emergency Medicine

## 2012-04-06 DIAGNOSIS — R1011 Right upper quadrant pain: Secondary | ICD-10-CM

## 2012-04-06 NOTE — Progress Notes (Signed)
1130 Reviewed with patient results of RUQ ultrasound. She has both pain and nausea medicine at home.  US Abdomen Limited Ruq  04/06/2012  *RADIOLOGY REPORT*  Clinical Data:  Right upper quadrant pain.  LIMITED ABDOMINAL ULTRASOUND - RIGHT UPPER QUADRANT  Comparison:  None.  Findings:  Gallbladder:  There is no evidence for gallstones, gallbladder wall thickening or pericholecystic fluid.  The sonographer reports no sonographic Murphy's sign.  Common bile duct:  Nondilated at 2 - 3 mm diameter.  Liver:  Homogeneous echotexture without intrahepatic biliary duct dilatation.  IMPRESSION: Unremarkable exam.  No findings to explain the patient's history of right upper quadrant pain.                   Original Report Authenticated By: ERIC A. MANSELL, M.D.

## 2013-04-09 ENCOUNTER — Encounter (HOSPITAL_COMMUNITY): Payer: Self-pay

## 2013-04-09 ENCOUNTER — Emergency Department (HOSPITAL_COMMUNITY)
Admission: EM | Admit: 2013-04-09 | Discharge: 2013-04-10 | Disposition: A | Discharge status: Home / Self Care | Payer: Self-pay | Attending: Emergency Medicine | Admitting: Emergency Medicine

## 2013-04-09 DIAGNOSIS — K0889 Other specified disorders of teeth and supporting structures: Secondary | ICD-10-CM

## 2013-04-09 DIAGNOSIS — F17200 Nicotine dependence, unspecified, uncomplicated: Secondary | ICD-10-CM | POA: Insufficient documentation

## 2013-04-09 DIAGNOSIS — R221 Localized swelling, mass and lump, neck: Secondary | ICD-10-CM | POA: Insufficient documentation

## 2013-04-09 DIAGNOSIS — K089 Disorder of teeth and supporting structures, unspecified: Secondary | ICD-10-CM | POA: Insufficient documentation

## 2013-04-09 DIAGNOSIS — R220 Localized swelling, mass and lump, head: Secondary | ICD-10-CM

## 2013-04-09 MED ORDER — VANCOMYCIN HCL IN DEXTROSE 1-5 GM/200ML-% IV SOLN
1000.0000 mg | Freq: Once | INTRAVENOUS | Status: AC
  Administered 2013-04-09: 1000 mg via INTRAVENOUS
  Filled 2013-04-09: qty 200

## 2013-04-09 MED ORDER — MORPHINE SULFATE 4 MG/ML IJ SOLN
2.0000 mg | Freq: Once | INTRAMUSCULAR | Status: AC
  Administered 2013-04-09: 2 mg via INTRAVENOUS
  Filled 2013-04-09: qty 1

## 2013-04-09 MED ORDER — SODIUM CHLORIDE 0.9 % IV BOLUS (SEPSIS)
1000.0000 mL | Freq: Once | INTRAVENOUS | Status: AC
  Administered 2013-04-09: 1000 mL via INTRAVENOUS

## 2013-04-09 MED ORDER — DEXTROSE 5 % IV SOLN
1.0000 g | Freq: Once | INTRAVENOUS | Status: AC
  Administered 2013-04-10: 1 g via INTRAVENOUS
  Filled 2013-04-09: qty 10

## 2013-04-09 MED ORDER — ONDANSETRON HCL 4 MG/2ML IJ SOLN
4.0000 mg | Freq: Once | INTRAMUSCULAR | Status: AC
  Administered 2013-04-09: 4 mg via INTRAVENOUS
  Filled 2013-04-09: qty 2

## 2013-04-09 NOTE — ED Provider Notes (Signed)
History  This chart was scribed for [employer]. Colon Branch, MD by Greggory Stallion, ED Scribe and Bennett Scrape, ED Scribe. This patient was seen in room APA07/APA07 and the patient's care was started at 11:04 PM.   CSN: [id number]  Arrival date & time 04/09/13  2228     Chief Complaint  Patient presents with  . Dental Pain     The history is provided by the patient. No language interpreter was used.    Brittany [name] is a 27 y.o. female who presents to the Emergency Department complaining of gradually worsening left, upper dental pain for the past two days with associated facial swelling. She reports that she has taken Tylenol with no relief. Pt denies emesis as an associated symptom.   History reviewed. No pertinent past medical history.  Past Surgical History  Procedure Laterality Date  . Nephrectomy living donor      History reviewed. No pertinent family history.  History  Substance Use Topics  . Smoking status: Current Every Day Smoker -- 0.50 packs/day    Types: Cigarettes  . Smokeless tobacco: Not on file  . Alcohol Use: No    OB History   Grav Para Term Preterm Abortions TAB SAB Ect Mult Living   2 2 2       2       Review of Systems  Constitutional: Negative for fever.       10 systems reviewed and are negative for acute change except as noted in the HPI  HENT: Positive for facial swelling and dental problem (right, upper). Negative for congestion.   Eyes: Negative for discharge and redness.  Respiratory: Negative for cough and shortness of breath.   Cardiovascular: Negative for chest pain.  Gastrointestinal: Negative for vomiting and abdominal pain.  Musculoskeletal: Negative for back pain.  Skin: Negative for rash.  Neurological: Negative for syncope, numbness and headaches.  Psychiatric/Behavioral:       No behavior change    Allergies  Ibuprofen  Home Medications   Current Outpatient Rx  Name  Route  Sig  Dispense  Refill  . acetaminophen  (TYLENOL) 500 MG tablet   Oral   Take 1,000-1,500 mg by mouth 2 (two) times daily as needed. For pain          . EXPIRED: promethazine (PHENERGAN) 25 MG tablet   Oral   Take 1 tablet (25 mg total) by mouth every 6 (six) hours as needed for nausea.   20 tablet   0   . promethazine (PHENERGAN) 25 MG tablet   Oral   Take 25 mg by mouth every 6 (six) hours as needed. nausea         . EXPIRED: promethazine (PHENERGAN) 25 MG tablet   Oral   Take 1 tablet (25 mg total) by mouth every 6 (six) hours as needed for nausea.   20 tablet   0     Triage Vitals: BP 114/64  Pulse 135  Temp(Src) 99.5 F (37.5 C) (Oral)  Resp 22  Ht 5\' 3"  (1.6 m)  Wt 155 lb (70.308 kg)  BMI 27.46 kg/m2  SpO2 100%  LMP 03/16/2013  Physical Exam  Nursing note and vitals reviewed. Constitutional: She is oriented to person, place, and time. She appears well-developed and well-nourished. No distress.  HENT:  Head: Normocephalic and atraumatic.  Left sided facial cellulitis. Broken teeth along entire top at the gumline. No abscess.  Eyes: EOM are normal.  Neck: Neck supple. No tracheal deviation  present.  Cardiovascular: Normal rate and regular rhythm.  Exam reveals no gallop and no friction rub.   No murmur heard. Pulmonary/Chest: Effort normal. No respiratory distress.  Musculoskeletal: Normal range of motion.  Neurological: She is alert and oriented to person, place, and time.  Skin: Skin is warm and dry.  Psychiatric: She has a normal mood and affect. Her behavior is normal.    ED Course  Procedures (including critical care time)  DIAGNOSTIC STUDIES: Oxygen Saturation is 100% on RA, normal by my interpretation.    COORDINATION OF CARE: 11:25 PM-Discussed treatment plan which includes antibiotics and  Medications  sodium chloride 0.9 % bolus 1,000 mL (not administered)  cefTRIAXone (ROCEPHIN) 1 g in dextrose 5 % 50 mL IVPB (not administered)  vancomycin (VANCOCIN) IVPB 1000 mg/200 mL premix  (not administered)  morphine 4 MG/ML injection 2 mg (not administered)  ondansetron (ZOFRAN) injection 4 mg (not administered)    with pt at bedside and pt agreed to plan.      MDM  Patient with swelling and redness to her face as a result of bad teeth. Given antibiotics IV. Will send home with antibiotics and pain medicine. She is to follow up with a dentist. Pt stable in ED with no significant deterioration in condition.The patient appears reasonably screened and/or stabilized for discharge and I doubt any other medical condition or other [facility] requiring further screening, evaluation, or treatment in the ED at this time prior to discharge.  I personally performed the services described in this documentation, which was scribed in my presence. The recorded information has been reviewed and considered.   MDM Reviewed: nursing note and vitals           Nicoletta Dress. Colon Branch, MD 04/10/13 4540

## 2013-04-09 NOTE — ED Notes (Signed)
Pain upper left canine tooth for 3 days, now with facial swelling

## 2013-04-10 MED ORDER — ACETAMINOPHEN 500 MG PO TABS
ORAL_TABLET | ORAL | Status: AC
  Administered 2013-04-10: 1000 mg
  Filled 2013-04-10: qty 2

## 2013-04-10 MED ORDER — HYDROCODONE-ACETAMINOPHEN 5-325 MG PO TABS: 1.0000 | ORAL_TABLET | ORAL | Status: DC | PRN

## 2013-04-10 MED ORDER — PENICILLIN V POTASSIUM 500 MG PO TABS: 500.0000 mg | ORAL_TABLET | Freq: Four times a day (QID) | ORAL | Status: AC

## 2013-04-10 NOTE — ED Notes (Signed)
Pt still receiving antibiotic

## 2013-04-10 NOTE — ED Notes (Signed)
Pt still has antibiotics running.

## 2013-09-20 ENCOUNTER — Emergency Department (HOSPITAL_COMMUNITY)
Admission: EM | Admit: 2013-09-20 | Discharge: 2013-09-20 | Disposition: A | Discharge status: Home / Self Care | Payer: Self-pay | Attending: Emergency Medicine | Admitting: Emergency Medicine

## 2013-09-20 ENCOUNTER — Encounter (HOSPITAL_COMMUNITY): Payer: Self-pay | Admitting: *Deleted

## 2013-09-20 DIAGNOSIS — K1370 Unspecified lesions of oral mucosa: Secondary | ICD-10-CM | POA: Insufficient documentation

## 2013-09-20 DIAGNOSIS — F17200 Nicotine dependence, unspecified, uncomplicated: Secondary | ICD-10-CM | POA: Insufficient documentation

## 2013-09-20 DIAGNOSIS — K0381 Cracked tooth: Secondary | ICD-10-CM | POA: Insufficient documentation

## 2013-09-20 DIAGNOSIS — K089 Disorder of teeth and supporting structures, unspecified: Secondary | ICD-10-CM | POA: Insufficient documentation

## 2013-09-20 DIAGNOSIS — K0889 Other specified disorders of teeth and supporting structures: Secondary | ICD-10-CM

## 2013-09-20 MED ORDER — HYDROCODONE-ACETAMINOPHEN 5-325 MG PO TABS: 2.0000 | ORAL_TABLET | ORAL | Status: DC | PRN

## 2013-09-20 MED ORDER — AMOXICILLIN 500 MG PO CAPS: 500.0000 mg | ORAL_CAPSULE | Freq: Three times a day (TID) | ORAL | Status: DC

## 2013-09-20 NOTE — ED Notes (Signed)
Dental pain began yesterday. NAD.

## 2013-09-20 NOTE — ED Provider Notes (Signed)
CSN: [id number]     Arrival date & time 09/20/13  1619 History   First MD Initiated Contact with Patient 09/20/13 1730     Chief Complaint  Patient presents with  . Dental Pain   (Consider location/radiation/quality/duration/timing/severity/associated sxs/prior Treatment) Patient is a 27 y.o. female presenting with tooth pain. The history is provided by the patient. No language interpreter was used.  Dental Pain Location:  Upper Upper teeth location:  14/LU 1st molar Quality:  Aching Severity:  Moderate Onset quality:  Gradual Duration:  1 day Timing:  Constant Chronicity:  New Context: not abscess   Previous work-up:  Dental exam Relieved by:  Nothing Worsened by:  Nothing tried Ineffective treatments:  None tried Pt complains of a toothache.  No dentist.    History reviewed. No pertinent past medical history. Past Surgical History  Procedure Laterality Date  . Nephrectomy living donor     No family history on file. History  Substance Use Topics  . Smoking status: Current Every Day Smoker -- 0.50 packs/day    Types: Cigarettes  . Smokeless tobacco: Not on file  . Alcohol Use: No   OB History   Grav Para Term Preterm Abortions TAB SAB Ect Mult Living   2 2 2       2      Review of Systems  HENT: Positive for dental problem.   All other systems reviewed and are negative.    Allergies  Ibuprofen  Home Medications   Current Outpatient Rx  Name  Route  Sig  Dispense  Refill  . acetaminophen (TYLENOL) 500 MG tablet   Oral   Take 1,000-1,500 mg by mouth 2 (two) times daily as needed. For pain          . HYDROcodone-acetaminophen (NORCO/VICODIN) 5-325 MG per tablet   Oral   Take 1 tablet by mouth every 4 (four) hours as needed for pain.   15 tablet   0   . EXPIRED: promethazine (PHENERGAN) 25 MG tablet   Oral   Take 1 tablet (25 mg total) by mouth every 6 (six) hours as needed for nausea.   20 tablet   0   . promethazine (PHENERGAN) 25 MG tablet  Oral   Take 25 mg by mouth every 6 (six) hours as needed. nausea         . EXPIRED: promethazine (PHENERGAN) 25 MG tablet   Oral   Take 1 tablet (25 mg total) by mouth every 6 (six) hours as needed for nausea.   20 tablet   0    BP 126/78  Pulse 74  Temp(Src) 97.8 F (36.6 C) (Oral)  Resp 18  SpO2 100%  LMP 09/09/2013 Physical Exam  Nursing note and vitals reviewed. HENT:  Nose: Nose normal.  Mouth/Throat: Oropharynx is clear and moist.  Multiple broken teeth  Swelling gum  Eyes: Pupils are equal, round, and reactive to light.  Neck: Normal range of motion.  Neurological: She is alert.  Skin: Skin is warm.  Psychiatric: She has a normal mood and affect.    ED Course  Procedures (including critical care time) Labs Review Labs Reviewed - No data to display Imaging Review No results found.  MDM   1. Toothache   amoxicillian and hydrocodone    Lonia Skinner [place], [place] 09/20/13 1934

## 2013-09-20 NOTE — ED Notes (Signed)
Pt has mult dental caries and broken teeth, Pain lt upper gum. Since yesterday

## 2013-09-22 NOTE — ED Provider Notes (Signed)
Medical screening examination/treatment/procedure(s) were performed by non-physician practitioner and as supervising physician I was immediately available for consultation/collaboration.   Shanira Tine L Janee Ureste, MD 09/22/13 0806 

## 2013-12-28 ENCOUNTER — Encounter (HOSPITAL_COMMUNITY): Payer: Self-pay | Admitting: Emergency Medicine

## 2013-12-28 ENCOUNTER — Emergency Department (HOSPITAL_COMMUNITY)
Admission: EM | Admit: 2013-12-28 | Discharge: 2013-12-28 | Disposition: A | Discharge status: Home / Self Care | Payer: Self-pay | Attending: Emergency Medicine | Admitting: Emergency Medicine

## 2013-12-28 DIAGNOSIS — F17200 Nicotine dependence, unspecified, uncomplicated: Secondary | ICD-10-CM | POA: Insufficient documentation

## 2013-12-28 DIAGNOSIS — K029 Dental caries, unspecified: Secondary | ICD-10-CM | POA: Insufficient documentation

## 2013-12-28 DIAGNOSIS — R509 Fever, unspecified: Secondary | ICD-10-CM | POA: Insufficient documentation

## 2013-12-28 MED ORDER — HYDROMORPHONE HCL PF 1 MG/ML IJ SOLN
1.0000 mg | Freq: Once | INTRAMUSCULAR | Status: AC
  Administered 2013-12-28: 1 mg via INTRAVENOUS
  Filled 2013-12-28: qty 1

## 2013-12-28 MED ORDER — ONDANSETRON HCL 4 MG/2ML IJ SOLN
4.0000 mg | Freq: Once | INTRAMUSCULAR | Status: AC
  Administered 2013-12-28: 4 mg via INTRAVENOUS
  Filled 2013-12-28: qty 2

## 2013-12-28 MED ORDER — CLINDAMYCIN HCL 300 MG PO CAPS: 300.0000 mg | ORAL_CAPSULE | Freq: Four times a day (QID) | ORAL | Status: DC

## 2013-12-28 MED ORDER — OXYCODONE-ACETAMINOPHEN 5-325 MG PO TABS: 2.0000 | ORAL_TABLET | ORAL | Status: DC | PRN

## 2013-12-28 MED ORDER — CLINDAMYCIN PHOSPHATE 900 MG/50ML IV SOLN
900.0000 mg | Freq: Once | INTRAVENOUS | Status: AC
  Administered 2013-12-28: 900 mg via INTRAVENOUS
  Filled 2013-12-28: qty 50

## 2013-12-28 MED ORDER — SODIUM CHLORIDE 0.9 % IV BOLUS (SEPSIS)
1000.0000 mL | Freq: Once | INTRAVENOUS | Status: AC
  Administered 2013-12-28: 1000 mL via INTRAVENOUS

## 2013-12-28 NOTE — Discharge Instructions (Signed)
Dental Caries Dental caries is tooth decay. This decay can cause a hole in teeth (cavity) that can get bigger and deeper over time. HOME CARE  Brush and floss your teeth. Do this at least two times a day.  Use a fluoride toothpaste.  Use a mouth rinse if told by your dentist or doctor.  Eat less sugary and starchy foods. Drink less sugary drinks.  Avoid snacking often on sugary and starchy foods. Avoid sipping often on sugary drinks.  Keep regular checkups and cleanings with your dentist.  Use fluoride supplements if told by your dentist or doctor.  Allow fluoride to be applied to teeth if told by your dentist or doctor. MAKE SURE YOU:  Understand these instructions.  Will watch your condition.  Will get help right away if you are not doing well or get worse. Document Released: 09/10/2008 Document Revised: 08/04/2013 Document Reviewed: 12/04/2012 [facility] Patient Information 2014 [place], [place].  You must see a dentist. Phone number given. Call within the next 48 hours. Medication for pain. Also prescription for antibiotic.

## 2013-12-28 NOTE — ED Provider Notes (Addendum)
CSN: [id number]     Arrival date & time 12/28/13  1149 History   This chart was scribed for Donnetta Hutching, MD, by Yevette Edwards, ED Scribe. This patient was seen in room APA03/APA03 and the patient's care was started at 12:33 PM.  First MD Initiated Contact with Patient 12/28/13 1217     Chief Complaint  Patient presents with  . Sinusitis    The history is provided by the patient. No language interpreter was used.   HPI Comments: Brittany [name] is a 28 y.o. female who presents to the Emergency Department complaining of gradually-increasing pain to her right, upper tooth with associated pain to her face. The pt rates the pain as 8/10, and she characterizes the pain as "aching." She reports she has a h/o dental issues. The pt is a current smoker.  Palpation makes symptoms worse. Severity is moderate to severe History reviewed. No pertinent past medical history. Past Surgical History  Procedure Laterality Date  . Nephrectomy living donor     No family history on file. History  Substance Use Topics  . Smoking status: Current Every Day Smoker -- 0.50 packs/day    Types: Cigarettes  . Smokeless tobacco: Not on file  . Alcohol Use: No   OB History   Grav Para Term Preterm Abortions TAB SAB Ect Mult Living   2 2 2       2      Review of Systems  Constitutional: Positive for fever (99.2 F in the ED. ).  HENT: Positive for dental problem and facial swelling.     Allergies  Ibuprofen  Home Medications   Current Outpatient Rx  Name  Route  Sig  Dispense  Refill  . acetaminophen (TYLENOL) 500 MG tablet   Oral   Take 1,000 mg by mouth 4 (four) times daily as needed for mild pain. For pain          Triage Vitals: BP 136/92  Pulse 140  Temp(Src) 99.2 F (37.3 C) (Oral)  Resp 16  Ht 5\' 3"  (1.6 m)  Wt 160 lb (72.576 kg)  BMI 28.35 kg/m2  SpO2 100%  LMP 12/24/2013  Physical Exam  Nursing note and vitals reviewed. Constitutional: She is oriented to person, place, and time.  She appears well-developed and well-nourished.  HENT:  Head: Normocephalic and atraumatic.  Tenderness to cheeks bilaterally and upper lip. Extensive carries to upper frontal teeth bilaterally.  Eyes: Conjunctivae and EOM are normal. Pupils are equal, round, and reactive to light.  Neck: Normal range of motion. Neck supple.  Cardiovascular: Normal rate, regular rhythm and normal heart sounds.   Pulmonary/Chest: Effort normal and breath sounds normal.  Abdominal: Soft. Bowel sounds are normal.  Musculoskeletal: Normal range of motion.  Neurological: She is alert and oriented to person, place, and time.  Skin: Skin is warm and dry.  Psychiatric: She has a normal mood and affect. Her behavior is normal.    ED Course  Procedures (including critical care time)  DIAGNOSTIC STUDIES: Oxygen Saturation is 100% on room air, normal by my interpretation.    COORDINATION OF CARE:  12:38 PM- Discussed treatment plan with patient, which includes pain medication and antibiotics, and the patient agreed to the plan.   Labs Review Labs Reviewed - No data to display Imaging Review No results found.  EKG Interpretation   None       MDM  No diagnosis found. Patient has obvious caries and eroded teeth.    Rx IV  pain medicine and IV clindamycin.    Discharge medications by mouth clindamycin, Percocet.    Referral to general dentist Dr. Lucky CowboyKnox  I personally performed the services described in this documentation, which was scribed in my presence. The recorded information has been reviewed and is accurate.    Donnetta HutchingBrian Cristan Hout, MD 12/28/13 1550  Donnetta HutchingBrian Lumen [place], MD 12/28/13 [id number]

## 2013-12-28 NOTE — ED Notes (Signed)
Pt alert & oriented x4, stable gait. Patient given discharge instructions, paperwork & prescription(s). Patient  instructed to stop at the registration desk to finish any additional paperwork. Patient verbalized understanding. Pt left department w/ no further questions. 

## 2013-12-28 NOTE — ED Notes (Signed)
Sinus pain and pressure starting yesterday.

## 2014-04-27 ENCOUNTER — Encounter (HOSPITAL_COMMUNITY): Payer: Self-pay | Admitting: Emergency Medicine

## 2014-04-27 ENCOUNTER — Emergency Department (HOSPITAL_COMMUNITY)
Admission: EM | Admit: 2014-04-27 | Discharge: 2014-04-27 | Disposition: A | Discharge status: Home / Self Care | Payer: Self-pay | Attending: Emergency Medicine | Admitting: Emergency Medicine

## 2014-04-27 DIAGNOSIS — K0889 Other specified disorders of teeth and supporting structures: Secondary | ICD-10-CM

## 2014-04-27 DIAGNOSIS — K029 Dental caries, unspecified: Secondary | ICD-10-CM | POA: Insufficient documentation

## 2014-04-27 DIAGNOSIS — F17200 Nicotine dependence, unspecified, uncomplicated: Secondary | ICD-10-CM | POA: Insufficient documentation

## 2014-04-27 DIAGNOSIS — Z79899 Other long term (current) drug therapy: Secondary | ICD-10-CM | POA: Insufficient documentation

## 2014-04-27 MED ORDER — HYDROCODONE-ACETAMINOPHEN 5-325 MG PO TABS: ORAL_TABLET | ORAL | Status: DC

## 2014-04-27 MED ORDER — CLINDAMYCIN HCL 150 MG PO CAPS: ORAL_CAPSULE | ORAL | Status: DC

## 2014-04-27 NOTE — Discharge Instructions (Signed)
°Emergency Department Resource Guide °1) Find a Doctor and Pay Out of Pocket °Although you won't have to find out who is covered by your insurance plan, it is a good idea to ask around and get recommendations. You will then need to call the office and see if the doctor you have chosen will accept you as a new patient and what types of options they offer for patients who are self-pay. Some doctors offer discounts or will set up payment plans for their patients who do not have insurance, but you will need to ask so you aren't surprised when you get to your appointment. ° °2) Contact Your Local Health Department °Not all health departments have doctors that can see patients for sick visits, but many do, so it is worth a call to see if yours does. If you don't know where your local health department is, you can check in your phone book. The CDC also has a tool to help you locate your state's health department, and many state websites also have listings of all of their local health departments. ° °3) Find a Walk-in Clinic °If your illness is not likely to be very severe or complicated, you may want to try a walk in clinic. These are popping up all over the country in pharmacies, drugstores, and shopping centers. They're usually staffed by nurse practitioners or physician assistants that have been trained to treat common illnesses and complaints. They're usually fairly quick and inexpensive. However, if you have serious medical issues or chronic medical problems, these are probably not your best option. ° °No Primary Care Doctor: °- Call [facility] at  832-8000 - they can help you locate a primary care doctor that  accepts your insurance, provides certain services, etc. °- Physician Referral Service- 1-800-533-3463 ° °Chronic Pain Problems: °Organization         Address  Phone   Notes  °[facility]  (336) 297-2271 Patients need to be referred by their primary care doctor.  ° °Medication  Assistance: °Organization         Address  Phone   Notes  °[place] [facility] [address] °[place], [place] [address] (336) 641-8030 --Must be a resident of [place] °-- Must have NO insurance coverage whatsoever (no Medicaid/ Medicare, etc.) °-- The pt. MUST have a primary care doctor that directs their care regularly and follows them in the community °  °[facility]  (866) 331-1348   °[employer]  (888) 892-1162   ° °Agencies that provide inexpensive medical care: °Organization         Address  Phone   Notes  °[facility]  (336) 832-8035   °[facility]    (336) 832-7272   °[facility] [address] °[place], [place] [address] (336) 832-4777   °[facility] [address], °[place] (336) 271-4999   °[facility]    (336) 373-0678   °[facility]    (336) 272-1050   °[facility] ° [address], [place] Phone:  (336) 832-4444, Fax:  (336) 832-4440 Hours of Operation:  9 am - 6 pm, M-F.  Also accepts Medicaid/Medicare and self-pay.  °[facility] ° [address], [place] Phone: (336) 832-3150, Fax: (336) 832-3151. Hours of Operation:  8:30 am - 5:30 pm, M-F.  Also accepts Medicaid and self-pay.  °[facility] [place] [address], [place] Phone: (336) 878-6027   °[facility] [address], [place], [place] (336)723-1848, Ext. 123 Mondays & Thursdays: 7-9 AM.  First 15 patients are seen on a first come, first serve basis. °  ° °Medicaid-accepting [place] Providers: ° °Organization         Address  Phone   Notes  °[facility] [address], [place] (336) 641-2100 Also accepts self-pay patients.  °[facility] [address], [place] ° (336) 856-9996   °[facility] [address], [place]  (336) 288-8857   °[facility] [address] [place] [address], [place] (336) 299-7000   °Veita Bland [address], [place]  ° (336) 373-1557 Only accepts [place] Access Medicaid patients after they have their name applied to their card.  ° °Self-Pay (no insurance) in [place]: ° °Organization         Address  Phone   Notes  °Sickle Cell Patients, [facility] [address], [place] (336) 832-1970   °[facility] [address], [place] (336) 832-4400   °[facility] ° [address], [place] (336) 992-4800   °[facility]/Dr. Osei-Bonsu ° [address] [place] [address], [place] or [address], [place] (336) 841-8500 Phone number for both [place] and [place] locations is the same.  °[facility] [address], [place] (336) 299-0000   °[facility] [address] [place] [address], [place] or [address] (336) 852-7530 °(336) 878-2260   °[facility] [address], [place] (336) 350-1642, phone; (336) 294-5005, fax Sees patients 1st and 3rd Saturday of every month.  Must not qualify for public or private insurance (i.e. Medicaid, Medicare, [place] Health Choice, Veterans' Benefits) • Household income should be no more than 200% of the poverty level •The clinic cannot treat you if you are pregnant or think you are pregnant • Sexually transmitted diseases are not treated at the clinic.  ° ° °Dental Care: °Organization         Address  Phone  Notes  °[place] [facility] [address], [place] (336) 641-6152 Accepts children up to age 21 who are enrolled in Medicaid or [place] Health Choice; pregnant women with a Medicaid card; and children who have applied for Medicaid or [place] Health Choice, but were declined, whose parents can pay a reduced fee at time of service.  °[place]  [facility] [place]  [address], [place] (336) 641-7733 Accepts children up to age 21 who are enrolled in Medicaid or [place] Health Choice; pregnant women with a Medicaid card; and children who have applied for Medicaid or [place] Health Choice, but were declined, whose parents can pay a reduced fee at time of service.  °[facility] ° [address], [place] (336) 641-4533 Patients are seen by appointment only. Walk-ins are not accepted. [facility] will see patients 18 years of age and older. °Monday - Tuesday (8am-5pm) °Most Wednesdays (8:30-5pm) °$30 per visit, cash only  °[facility] ° [address], [place] (336) 641-4533 Patients are seen by appointment only. Walk-ins are not accepted. [facility] will see patients 18 years of age and older. °One   Wednesday Evening (Monthly: Volunteer Based).  $30 per visit, cash only  °[facility]  (919) 537-3737 for adults; Children under age 4, call Graduate Pediatric Dentistry at (919) [phone]. Children aged 4-14, please call (919) [phone] to request a pediatric application. ° Dental services are provided in all areas of dental care including fillings, crowns and bridges, complete and partial dentures, implants, gum treatment, root canals, and extractions. Preventive care is also provided. Treatment is provided to both adults and children. °Patients are selected via a lottery and there is often a waiting list. °  °[facility] [address], °[place] ° (336) 763-8833 [email or web address] °  °[facility] [address], [place], [place] (336)723-1848, Ext. 123 Second and Fourth Thursday of each month, opens at 6:30 AM; Clinic ends at 9 AM.  Patients are seen on a first-come first-served basis, and a limited number are seen during each clinic.  ° °[facility] ° [address], [place], [place] (336) 723-7904    Eligibility Requirements °You must have lived in [place], or [place] counties for at least the last three months. °  You cannot be eligible for state or federal sponsored healthcare insurance, including Veterans Administration, Medicaid, or Medicare. °  You generally cannot be eligible for healthcare insurance through your employer.  °  How to apply: °Eligibility screenings are held every Tuesday and Wednesday afternoon from 1:00 pm until 4:00 pm. You do not need an appointment for the interview!  °[facility] [address], [place], [place] 336-631-2330   °[facility]  336-342-8273   °[facility]  336-703-3100   °[facility]  336-570-6415   ° °Behavioral Health Resources in the Community: °Intensive Outpatient Programs °Organization         Address  Phone  Notes  °[facility] [address], [place], [place] 336-878-6098   °[facility] [address], [place], [place] 336-832-9800   °[facility] [address], [place], [place] ° 336-882-2125   °[facility] [address],  °[place], [place] 1-800-853-5163 or 336-641-4981   °Substance Abuse Resources °Organization         Address  Phone  Notes  °[facility]  336-882-2125   °[facility]  336-784-9470   °[facility]  336-285-9073   °[facility]  336-845-3988   °Residential & Outpatient Substance Abuse Program  1-800-659-3381   °Psychological Services °Organization         Address  Phone  Notes  °[facility]  336- 832-9600   °[facility]  336- 378-7881   °[facility] [address], [place] 1-800-853-5163 or 336-641-4981   ° °Mobile Crisis Teams °Organization         Address  Phone  Notes  °[facility], Mobile Crisis Care Unit  1-877-626-1772   °Assertive °[facility] ° [address]  [place], [place] 336-834-9664   °Sharon DeEsch [address] °[place] 336-554-5454   ° °Self-Help/Support Groups °Organization         Address  Phone             Notes  °[facility]  - variety of support groups  336- 373-1402 Call for more information  °Narcotics Anonymous (NA), [facility] [address], °[place]  2 meetings at this location  ° °  Residential Treatment Programs °Organization         Address  Phone  Notes  °[facility] [address],    °[place]  1-866-801-8205   °[facility] ° [address], [place], [place] 704-293-8524   °[facility] [address], [place] 336-845-3988 Admissions: 8am-3pm M-F  °[facility] [address]    °[place], [place] 336-841-1104   °[facility] [address], [place], [place] 336-379-7146   °[facility] [address]  °[place], [place] 336-285-9073   °[facility] - Intensive Outpatient [address], [place], [place] 336-852-3033   °[facility] [address]  °[place], [place] 1-877-615-2722 or 336-784-9470   °[facility] [address] [place], [place] 336-227-7417 Accepts Medicaid  °[facility] [address]  °[place] 1-800-659-3381 Substance Abuse/Addiction Treatment  ° °[place] Behavioral Health Resources °Organization         Address  Phone  Notes  °[facility]  (888) 581-9988   °Julie Brannon, PhD [address] [place], [place]   (336) 349-5553 or (336) 951-0000   °[facility]   [address] °[place], [place] (336) 349-4454   °[facility] [address], [place], [place] (336) 342-8316 Insurance/Medicaid/sponsorship through [facility]  °[facility] [address]                                    [place], [place] (336) 342-8316 Therapy/tele-psych/case    °[facility] [address]  ° [place], [place] (336) 349-2233    °Dr. Arfeen  (336) 349-4544   °[facility] [place]  [employer] [place] [facility] 1) [address], [place] °2) [address], [place] °3)  [address], [place] (336) 349-3220 °(336) 342-7768 ° °(336) 342-8140   °[place] [facility] (336) 342-1394 or (336) 342-3537 (After Hours)    ° ° °Take the prescriptions as directed.  Call the dentist today to schedule a follow up appointment within the next week.  Return to the Emergency Department immediately sooner if worsening.  ° °

## 2014-04-27 NOTE — ED Notes (Signed)
Pt states dental pain since last night, states she believes she has an abscess.

## 2014-04-27 NOTE — ED Provider Notes (Signed)
CSN: [id number]     Arrival date & time 04/27/14  1308 History   First MD Initiated Contact with Patient 04/27/14 1336     Chief Complaint  Patient presents with  . Dental Pain      HPI Pt was seen at 1330. Per pt, c/o gradual onset and persistence of constant left upper teeth "pain" for the past several weeks, worse since yesterday.  Denies fevers, no intra-oral edema, no rash, no facial swelling, no dysphagia, no neck pain.   The condition is aggravated by nothing. The condition is relieved by nothing. The symptoms have been associated with no other complaints. The patient has no significant history of serious medical conditions.     History reviewed. No pertinent past medical history.  Past Surgical History  Procedure Laterality Date  . Nephrectomy living donor      History  Substance Use Topics  . Smoking status: Current Every Day Smoker -- 0.50 packs/day    Types: Cigarettes  . Smokeless tobacco: Not on file  . Alcohol Use: No   OB History   Grav Para Term Preterm Abortions TAB SAB Ect Mult Living   2 2 2       2      Review of Systems ROS: Statement: All systems negative except as marked or noted in the HPI; Constitutional: Negative for fever and chills. ; ; Eyes: Negative for eye pain and discharge. ; ; ENMT: Positive for dental caries, dental hygiene poor and toothache. Negative for ear pain, bleeding gums, dental injury, facial deformity, facial swelling, hoarseness, nasal congestion, sinus pressure, sore throat, throat swelling and tongue swollen. ; ; Cardiovascular: Negative for chest pain, palpitations, diaphoresis, dyspnea and peripheral edema. ; ; Respiratory: Negative for cough, wheezing and stridor. ; ; Gastrointestinal: Negative for nausea, vomiting, diarrhea and abdominal pain. ; ; Genitourinary: Negative for dysuria, flank pain and hematuria. ; ; Musculoskeletal: Negative for back pain and neck pain. ; ; Skin: Negative for rash and skin lesion. ; ; Neuro: Negative  for headache, lightheadedness and neck stiffness.      Allergies  Ibuprofen  Home Medications   Prior to Admission medications   Medication Sig Start Date End Date Taking? Authorizing Provider  acetaminophen (TYLENOL) 500 MG tablet Take 1,000 mg by mouth 4 (four) times daily as needed for mild pain. For pain    Historical Provider, MD  clindamycin (CLEOCIN) 300 MG capsule Take 1 capsule (300 mg total) by mouth 4 (four) times daily. X 7 days 12/28/13   Donnetta HutchingBrian Cook, MD  oxyCODONE-acetaminophen (PERCOCET) 5-325 MG per tablet Take 2 tablets by mouth every 4 (four) hours as needed. 12/28/13   Donnetta HutchingBrian Cook, MD   BP 142/92  Pulse 102  Temp(Src) 98.7 F (37.1 C) (Oral)  Resp 16  SpO2 100%  LMP 04/27/2014 Physical Exam 1335: Physical examination: Vital signs and O2 SAT: Reviewed; Constitutional: Well developed, Well nourished, Well hydrated, In no acute distress; Head and Face: Normocephalic, Atraumatic; Eyes: EOMI, PERRL, No scleral icterus; ENMT: Mouth and pharynx normal, Poor dentition, Widespread dental decay, Left TM normal, Right TM normal, Mucous membranes moist, +upper left 1st and 2nd premolars and molars with extensive dental decay.  No gingival erythema, edema, fluctuance, or drainage.  No intra-oral edema. No submandibular or sublingual edema. No hoarse voice, no drooling, no stridor. No trismus. ; Neck: Supple, Full range of motion, No lymphadenopathy; Cardiovascular: Regular rate and rhythm, No murmur, rub, or gallop; Respiratory: Breath sounds clear & equal bilaterally, No  rales, rhonchi, wheezes, Normal respiratory effort/excursion; Chest: Nontender, Movement normal; Extremities: Pulses normal, No tenderness, No edema; Neuro: AA&Ox3, Major CN grossly intact.  No gross focal motor or sensory deficits in extremities.; Skin: Color normal, No rash, No petechiae, Warm, Dry    ED Course  Procedures     EKG Interpretation None      MDM  MDM Reviewed: previous chart, nursing note and  vitals    1340:  Pt encouraged to f/u with dentist or oral surgeon for her dental needs for good continuity of care and definitive treatment.  Verb understanding.    Brittany AngerKathleen M Theoren Palka, DO 04/28/14 1038

## 2014-05-29 ENCOUNTER — Emergency Department (HOSPITAL_COMMUNITY)
Admission: EM | Admit: 2014-05-29 | Discharge: 2014-05-29 | Disposition: A | Discharge status: Home / Self Care | Payer: Self-pay | Attending: Emergency Medicine | Admitting: Emergency Medicine

## 2014-05-29 ENCOUNTER — Encounter (HOSPITAL_COMMUNITY): Payer: Self-pay | Admitting: Emergency Medicine

## 2014-05-29 ENCOUNTER — Emergency Department (HOSPITAL_COMMUNITY): Payer: Self-pay

## 2014-05-29 DIAGNOSIS — Z79899 Other long term (current) drug therapy: Secondary | ICD-10-CM | POA: Insufficient documentation

## 2014-05-29 DIAGNOSIS — Z792 Long term (current) use of antibiotics: Secondary | ICD-10-CM | POA: Insufficient documentation

## 2014-05-29 DIAGNOSIS — F17200 Nicotine dependence, unspecified, uncomplicated: Secondary | ICD-10-CM | POA: Insufficient documentation

## 2014-05-29 DIAGNOSIS — K029 Dental caries, unspecified: Secondary | ICD-10-CM | POA: Insufficient documentation

## 2014-05-29 DIAGNOSIS — K047 Periapical abscess without sinus: Secondary | ICD-10-CM

## 2014-05-29 DIAGNOSIS — R259 Unspecified abnormal involuntary movements: Secondary | ICD-10-CM | POA: Insufficient documentation

## 2014-05-29 DIAGNOSIS — K044 Acute apical periodontitis of pulpal origin: Secondary | ICD-10-CM | POA: Insufficient documentation

## 2014-05-29 DIAGNOSIS — Z3202 Encounter for pregnancy test, result negative: Secondary | ICD-10-CM | POA: Insufficient documentation

## 2014-05-29 LAB — CBC WITH DIFFERENTIAL/PLATELET
BASOS ABS: 0 10*3/uL (ref 0.0–0.1)
BASOS PCT: 0 % (ref 0–1)
EOS ABS: 0.2 10*3/uL (ref 0.0–0.7)
EOS PCT: 2 % (ref 0–5)
HCT: 35.9 % — ABNORMAL LOW (ref 36.0–46.0)
Hemoglobin: 11.5 g/dL — ABNORMAL LOW (ref 12.0–15.0)
Lymphocytes Relative: 19 % (ref 12–46)
Lymphs Abs: 1.7 10*3/uL (ref 0.7–4.0)
MCH: 25.4 pg — AB (ref 26.0–34.0)
MCHC: 32 g/dL (ref 30.0–36.0)
MCV: 79.4 fL (ref 78.0–100.0)
Monocytes Absolute: 1 10*3/uL (ref 0.1–1.0)
Monocytes Relative: 11 % (ref 3–12)
Neutro Abs: 6.1 10*3/uL (ref 1.7–7.7)
Neutrophils Relative %: 68 % (ref 43–77)
PLATELETS: 256 10*3/uL (ref 150–400)
RBC: 4.52 MIL/uL (ref 3.87–5.11)
RDW: 17.2 % — AB (ref 11.5–15.5)
WBC: 9 10*3/uL (ref 4.0–10.5)

## 2014-05-29 LAB — BASIC METABOLIC PANEL
BUN: 5 mg/dL — ABNORMAL LOW (ref 6–23)
CALCIUM: 8.8 mg/dL (ref 8.4–10.5)
CO2: 26 mEq/L (ref 19–32)
Chloride: 102 mEq/L (ref 96–112)
Creatinine, Ser: 0.77 mg/dL (ref 0.50–1.10)
GFR calc Af Amer: >90 mL/min (ref >90)
Glucose, Bld: 115 mg/dL — ABNORMAL HIGH (ref 70–99)
Potassium: 3.7 mEq/L (ref 3.7–5.3)
SODIUM: 139 meq/L (ref 137–147)

## 2014-05-29 LAB — POC URINE PREG, ED: Preg Test, Ur: NEGATIVE

## 2014-05-29 MED ORDER — MORPHINE SULFATE 4 MG/ML IJ SOLN
4.0000 mg | Freq: Once | INTRAMUSCULAR | Status: AC
  Administered 2014-05-29: 4 mg via INTRAVENOUS
  Filled 2014-05-29: qty 1

## 2014-05-29 MED ORDER — CLINDAMYCIN PHOSPHATE 600 MG/50ML IV SOLN
600.0000 mg | Freq: Once | INTRAVENOUS | Status: AC
  Administered 2014-05-29: 600 mg via INTRAVENOUS
  Filled 2014-05-29: qty 50

## 2014-05-29 MED ORDER — ONDANSETRON HCL 4 MG/2ML IJ SOLN
4.0000 mg | Freq: Once | INTRAMUSCULAR | Status: AC
  Administered 2014-05-29: 4 mg via INTRAVENOUS
  Filled 2014-05-29: qty 2

## 2014-05-29 MED ORDER — OXYCODONE-ACETAMINOPHEN 5-325 MG PO TABS: 1.0000 | ORAL_TABLET | ORAL | Status: DC | PRN

## 2014-05-29 MED ORDER — IOHEXOL 300 MG/ML  SOLN
75.0000 mL | Freq: Once | INTRAMUSCULAR | Status: AC | PRN
  Administered 2014-05-29: 75 mL via INTRAVENOUS

## 2014-05-29 MED ORDER — SODIUM CHLORIDE 0.9 % IV BOLUS (SEPSIS)
1000.0000 mL | Freq: Once | INTRAVENOUS | Status: AC
  Administered 2014-05-29: 1000 mL via INTRAVENOUS

## 2014-05-29 MED ORDER — CLINDAMYCIN HCL 150 MG PO CAPS: 150.0000 mg | ORAL_CAPSULE | Freq: Four times a day (QID) | ORAL | Status: DC

## 2014-05-29 NOTE — ED Notes (Signed)
Patient c/o right side jaw pain and swelling x2 days. Per patient woke this morning with severe pain in throat. Patient reports difficulty swallowing. Patient able to handle secretions at this time. Notable swelling to right side of face.

## 2014-05-29 NOTE — ED Provider Notes (Signed)
Medical screening examination/treatment/procedure(s) were performed by non-physician practitioner and as supervising physician I was immediately available for consultation/collaboration.     Geoffery Lyonsouglas Shaheen Mende, MD 05/29/14 [phone]

## 2014-05-29 NOTE — ED Notes (Signed)
IVPB completed not reaction noted.  Given Sprite PO, tolerated well.

## 2014-05-29 NOTE — ED Notes (Signed)
Pt alert & oriented x4, stable gait. Patient given discharge instructions, paperwork & prescription(s). Patient  instructed to stop at the registration desk to finish any additional paperwork. Patient verbalized understanding. Pt left department w/ no further questions. 

## 2014-05-29 NOTE — ED Notes (Signed)
PT woke up this am with right facial swelling and severe pain to right jaw.

## 2014-05-29 NOTE — ED Provider Notes (Signed)
CSN: [id number]     Arrival date & time 05/29/14  1640 History   First MD Initiated Contact with Patient 05/29/14 1642     Chief Complaint  Patient presents with  . Facial Swelling  . Sore Throat     (Consider location/radiation/quality/duration/timing/severity/associated sxs/prior Treatment) HPI Comments: Brittany [name] is a 28 y.o. Female presenting with pain and swelling along her right jaw and mouth which has been present for the past 2 days.  She reports a history of dental decay with multiple dental cavities.  She does not have a dentist.  Over the past 2 days she has increasing difficulty swallowing as moving her tongue causes increased pain, she denies throat pain.  She has taken hydrocodone prescribed here last month during another episode of dental infection, last dose was yesterday and provided mild relief.  She has had subjective fevers.  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     The history is provided by the patient and the spouse.    History reviewed. No pertinent past medical history. Past Surgical History  Procedure Laterality Date  . Nephrectomy living donor     Family History  Problem Relation Age of Onset  . Cancer Other    History  Substance Use Topics  . Smoking status: Current Every Day Smoker -- 0.50 packs/day for 3 years    Types: Cigarettes  . Smokeless tobacco: Never Used  . Alcohol Use: No   OB History   Grav Para Term Preterm Abortions TAB SAB Ect Mult Living   2 2 2       2      Review of Systems  Constitutional: Negative for fever.  HENT: Positive for dental problem and facial swelling. Negative for drooling, sore throat and voice change.   Respiratory: Negative for shortness of breath, wheezing and stridor.   Musculoskeletal: Negative for neck pain and neck stiffness.      Allergies  Ibuprofen  Home Medications   Prior to Admission medications   Medication Sig Start Date End Date  Taking? Authorizing Provider  acetaminophen (TYLENOL) 500 MG tablet Take 1,000 mg by mouth 4 (four) times daily as needed for mild pain. For pain   Yes Historical Provider, MD  oxyCODONE-acetaminophen (PERCOCET) 5-325 MG per tablet Take 2 tablets by mouth every 4 (four) hours as needed. 12/28/13  Yes Donnetta HutchingBrian Cook, MD  clindamycin (CLEOCIN) 150 MG capsule Take 1 capsule (150 mg total) by mouth every 6 (six) hours. 05/29/14   Burgess AmorJulie Zarian Colpitts, PA-C  oxyCODONE-acetaminophen (PERCOCET/ROXICET) 5-325 MG per tablet Take 1 tablet by mouth every 4 (four) hours as needed for severe pain. 05/29/14   Burgess AmorJulie Cervando Durnin, PA-C  oxyCODONE-acetaminophen (PERCOCET/ROXICET) 5-325 MG per tablet Take 1 tablet by mouth every 4 (four) hours as needed for moderate pain or severe pain. 05/29/14   Burgess AmorJulie Aira Sallade, PA-C   BP 136/86  Pulse 105  Temp(Src) 98.7 F (37.1 C) (Oral)  Resp 18  Ht 5\' 3"  (1.6 m)  Wt 145 lb (65.772 kg)  BMI 25.69 kg/m2  SpO2 100%  LMP 05/22/2014 Physical Exam  Constitutional: She is oriented to person, place, and time. She appears well-developed and well-nourished. No distress.  HENT:  Head: Normocephalic and atraumatic.  Right Ear: Tympanic membrane and external ear normal.  Left Ear: Tympanic membrane and external ear normal.  Mouth/Throat: Oropharynx is clear and moist. Mucous membranes are dry. There is trismus in the jaw. Dental caries present. No  uvula swelling.  Moderate edema and induration along right lateral jaw line extending into the right submandibular area.  No tenderness at the tmj and preauricular area.  Mild erythema along the right mandible.    Eyes: Conjunctivae are normal.  Neck: Normal range of motion. Neck supple.  Cardiovascular: Normal rate and normal heart sounds.   Pulmonary/Chest: Effort normal.  Abdominal: She exhibits no distension.  Musculoskeletal: Normal range of motion.  Lymphadenopathy:    She has no cervical adenopathy.  Neurological: She is alert and oriented to person,  place, and time.  Skin: Skin is warm and dry. No erythema.  Psychiatric: She has a normal mood and affect.    ED Course  Procedures (including critical care time) Labs Review Labs Reviewed  CBC WITH DIFFERENTIAL - Abnormal; Notable for the following:    Hemoglobin 11.5 (*)    HCT 35.9 (*)    MCH 25.4 (*)    RDW 17.2 (*)    All other components within normal limits  BASIC METABOLIC PANEL - Abnormal; Notable for the following:    Glucose, Bld 115 (*)    BUN 5 (*)    All other components within normal limits  POC URINE PREG, ED    Imaging Review Ct Maxillofacial W/cm  05/29/2014   CLINICAL DATA:  Right-sided mandibular pain. Sore throat. Right-sided facial swelling.  EXAM: CT MAXILLOFACIAL WITH CONTRAST  TECHNIQUE: Multidetector CT imaging of the maxillofacial structures was performed with intravenous contrast. Multiplanar CT image reconstructions were also generated. A small metallic BB was placed on the right temple in order to reliably differentiate right from left.  CONTRAST:  75mL OMNIPAQUE IOHEXOL 300 MG/ML  SOLN  COMPARISON:  None.  FINDINGS: Carious dentition is present. Periapical lucency is present around the right mandibular first premolar. Essentially radiating from this periapical lucency and carious to There is phlegmon in the right mandibular subcutaneous tissues. No discrete abscess is identified. Asymmetric enhancement and mandibular soft tissue swelling. Enlargement of a level 2 submandibular lymph node on the right (image 61 series 2, 14 mm x 9 mm). Vasculature appears within normal limits. Submandibular glands and parotid glands appear within normal limits. Paranasal sinuses are within normal limits. Grossly, intracranial contents are normal.  IMPRESSION: Odontogenic infection involving the right mandibular soft tissues without an abscess. Carious dentition. Findings are compatible with facial cellulitis from odontogenic source. Follow-up dental consultation is recommended.    Electronically Signed   By: Andreas Newport M.D.   On: 05/29/2014 18:30     EKG Interpretation None      MDM   Final diagnoses:  Dental infection    Patient was given IV fluids and an IV dose of clindamycin.  She was prescribed by mouth clindamycin and Percocet for home use.  She was given dental referrals.  She tolerated by mouth fluids prior to discharge home.  At reexam, patient is able to open her mouth more fully now that morphine has been given.  I doubt she has true trismus.  At first exam range of motion was limited by pain only.  She tolerated by mouth fluids while here.  Patients labs and/or radiological studies were viewed and considered during the medical decision making and disposition process. No abscess on CT or other emergent oral infection.  She was encouraged to establish care with a dentist and referrals were given.  In the interim,  She was advised return here for any worsened sx.  Discussed with Dr. Judd Lien prior to dc home.  Burgess AmorJulie Maegan Buller, PA-C 05/29/14 1904

## 2014-05-30 MED FILL — Oxycodone w/ Acetaminophen Tab 5-325 MG: ORAL | Qty: 6 | Status: AC

## 2014-06-01 ENCOUNTER — Encounter (HOSPITAL_COMMUNITY)
Admission: EM | Disposition: A | Discharge status: Home / Self Care | Payer: Self-pay | Source: Home / Self Care | Attending: Otolaryngology

## 2014-06-01 ENCOUNTER — Encounter (HOSPITAL_COMMUNITY): Payer: Self-pay | Admitting: Anesthesiology

## 2014-06-01 ENCOUNTER — Emergency Department (HOSPITAL_COMMUNITY): Payer: Self-pay

## 2014-06-01 ENCOUNTER — Inpatient Hospital Stay (HOSPITAL_COMMUNITY)
Admission: EM | Admit: 2014-06-01 | Discharge: 2014-06-04 | DRG: 138 | Disposition: A | Discharge status: Home / Self Care | Payer: Self-pay | Attending: Otolaryngology | Admitting: Otolaryngology

## 2014-06-01 ENCOUNTER — Encounter (HOSPITAL_COMMUNITY): Payer: Self-pay | Admitting: Emergency Medicine

## 2014-06-01 DIAGNOSIS — F17200 Nicotine dependence, unspecified, uncomplicated: Secondary | ICD-10-CM | POA: Diagnosis present

## 2014-06-01 DIAGNOSIS — K122 Cellulitis and abscess of mouth: Principal | ICD-10-CM | POA: Diagnosis present

## 2014-06-01 LAB — CBC WITH DIFFERENTIAL/PLATELET
BASOS ABS: 0 10*3/uL (ref 0.0–0.1)
Basophils Relative: 1 % (ref 0–1)
Eosinophils Absolute: 0.2 10*3/uL (ref 0.0–0.7)
Eosinophils Relative: 3 % (ref 0–5)
HCT: 35.6 % — ABNORMAL LOW (ref 36.0–46.0)
Hemoglobin: 11.4 g/dL — ABNORMAL LOW (ref 12.0–15.0)
LYMPHS ABS: 1.5 10*3/uL (ref 0.7–4.0)
LYMPHS PCT: 20 % (ref 12–46)
MCH: 25.6 pg — ABNORMAL LOW (ref 26.0–34.0)
MCHC: 32 g/dL (ref 30.0–36.0)
MCV: 80 fL (ref 78.0–100.0)
Monocytes Absolute: 0.5 10*3/uL (ref 0.1–1.0)
Monocytes Relative: 6 % (ref 3–12)
NEUTROS ABS: 5.4 10*3/uL (ref 1.7–7.7)
NEUTROS PCT: 70 % (ref 43–77)
Platelets: 359 10*3/uL (ref 150–400)
RBC: 4.45 MIL/uL (ref 3.87–5.11)
RDW: 17.2 % — AB (ref 11.5–15.5)
WBC: 7.6 10*3/uL (ref 4.0–10.5)

## 2014-06-01 LAB — COMPREHENSIVE METABOLIC PANEL
ALK PHOS: 82 U/L (ref 39–117)
ALT: 12 U/L (ref 0–35)
AST: 17 U/L (ref 0–37)
Albumin: 3.5 g/dL (ref 3.5–5.2)
BUN: 6 mg/dL (ref 6–23)
CO2: 27 meq/L (ref 19–32)
Calcium: 9.1 mg/dL (ref 8.4–10.5)
Chloride: 101 mEq/L (ref 96–112)
Creatinine, Ser: 0.76 mg/dL (ref 0.50–1.10)
GFR calc Af Amer: >90 mL/min (ref >90)
Glucose, Bld: 94 mg/dL (ref 70–99)
POTASSIUM: 4 meq/L (ref 3.7–5.3)
SODIUM: 140 meq/L (ref 137–147)
Total Bilirubin: 0.2 mg/dL — ABNORMAL LOW (ref 0.3–1.2)
Total Protein: 7.5 g/dL (ref 6.0–8.3)

## 2014-06-01 SURGERY — DISSECTION, NECK, RADICAL
Anesthesia: General

## 2014-06-01 MED ORDER — MORPHINE SULFATE 4 MG/ML IJ SOLN
4.0000 mg | Freq: Once | INTRAMUSCULAR | Status: AC
  Administered 2014-06-01: 4 mg via INTRAVENOUS
  Filled 2014-06-01: qty 1

## 2014-06-01 MED ORDER — CLINDAMYCIN PHOSPHATE 600 MG/50ML IV SOLN
600.0000 mg | Freq: Three times a day (TID) | INTRAVENOUS | Status: DC
  Administered 2014-06-02 – 2014-06-04 (×8): 600 mg via INTRAVENOUS
  Filled 2014-06-01 (×10): qty 50

## 2014-06-01 MED ORDER — IOHEXOL 300 MG/ML  SOLN
80.0000 mL | Freq: Once | INTRAMUSCULAR | Status: AC | PRN
  Administered 2014-06-01: 80 mL via INTRAVENOUS

## 2014-06-01 MED ORDER — SODIUM CHLORIDE 0.9 % IV BOLUS (SEPSIS)
1000.0000 mL | Freq: Once | INTRAVENOUS | Status: AC
  Administered 2014-06-01: 1000 mL via INTRAVENOUS

## 2014-06-01 MED ORDER — DEXTROSE-NACL 5-0.45 % IV SOLN
INTRAVENOUS | Status: DC
  Administered 2014-06-01 – 2014-06-03 (×4): via INTRAVENOUS

## 2014-06-01 MED ORDER — CLINDAMYCIN PHOSPHATE 600 MG/50ML IV SOLN
600.0000 mg | Freq: Once | INTRAVENOUS | Status: AC
  Administered 2014-06-01: 600 mg via INTRAVENOUS
  Filled 2014-06-01: qty 50

## 2014-06-01 MED ORDER — ONDANSETRON HCL 4 MG/2ML IJ SOLN
4.0000 mg | Freq: Once | INTRAMUSCULAR | Status: AC
  Administered 2014-06-01: 4 mg via INTRAVENOUS
  Filled 2014-06-01: qty 2

## 2014-06-01 MED ORDER — ONDANSETRON HCL 4 MG PO TABS: 4.0000 mg | ORAL_TABLET | ORAL | Status: DC | PRN

## 2014-06-01 MED ORDER — ONDANSETRON HCL 4 MG/2ML IJ SOLN
4.0000 mg | INTRAMUSCULAR | Status: DC | PRN
  Administered 2014-06-01: 4 mg via INTRAVENOUS
  Filled 2014-06-01: qty 2

## 2014-06-01 MED ORDER — OXYCODONE HCL 5 MG/5ML PO SOLN
5.0000 mg | ORAL | Status: DC | PRN
  Administered 2014-06-01 – 2014-06-02 (×4): 10 mg via ORAL
  Administered 2014-06-02: 5 mg via ORAL
  Administered 2014-06-02: 10 mg via ORAL
  Administered 2014-06-03: 5 mg via ORAL
  Administered 2014-06-03 – 2014-06-04 (×6): 10 mg via ORAL
  Filled 2014-06-01 (×2): qty 10
  Filled 2014-06-01: qty 5
  Filled 2014-06-01 (×9): qty 10

## 2014-06-01 MED ORDER — MORPHINE SULFATE 2 MG/ML IJ SOLN
2.0000 mg | INTRAMUSCULAR | Status: DC | PRN
  Administered 2014-06-01 – 2014-06-02 (×5): 4 mg via INTRAVENOUS
  Administered 2014-06-03: 2 mg via INTRAVENOUS
  Filled 2014-06-01: qty 2
  Filled 2014-06-01: qty 1
  Filled 2014-06-01 (×4): qty 2

## 2014-06-01 MED ORDER — CHLORHEXIDINE GLUCONATE 0.12 % MT SOLN
5.0000 mL | Freq: Four times a day (QID) | OROMUCOSAL | Status: DC
  Administered 2014-06-01 – 2014-06-04 (×8): 5 mL via OROMUCOSAL
  Filled 2014-06-01 (×11): qty 15

## 2014-06-01 NOTE — ED Notes (Signed)
Seen here x 3 days ago for abscess to right jaw, given abx.  Reports swelling is now in throat/neck and is having trouble swallowing, unable to swallow medications.  Denies difficulty breathing.

## 2014-06-01 NOTE — H&P (Signed)
Brittany [name], Brittany [name] 28 y.o., female [id number]     Chief Complaint: severe neck and throat pain  HPI: 28 yo wf, severely bad teeth x yrs, with several prior dental abscesses.  Onset Sunday with mouth/neck pain.  Severe difficulty swallowing.  Given Clindamycin 150 mg qid.  Was able to take meds, but no po intake x 2-3 days.  No trismus.  No breathing difficulty.  No voice change.  No documented fever.  Seen in [facility] ER today with CT suggesting possible RIGHT sided Ludwig's angina.  Sent to [facility] ED for possible I&D.  No DM, Pred, immune compromise.  1 PPD smoker.    PZW:CHENIDP reviewed. No pertinent past medical history.  Surg Hx: Past Surgical History  Procedure Laterality Date  . Nephrectomy living donor      FHx:   Family History  Problem Relation Age of Onset  . Cancer Other    SocHx:  reports that she has been smoking Cigarettes.  She has a 1.5 pack-year smoking history. She has never used smokeless tobacco. She reports that she does not drink alcohol or use illicit drugs.  ALLERGIES:  Allergies  Allergen Reactions  . 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      (Not in a hospital admission)  Results for orders placed during the hospital encounter of 06/01/14 (from the past 48 hour(s))  CBC WITH DIFFERENTIAL     Status: Abnormal   Collection Time    06/01/14  4:23 PM      Result Value Ref Range   WBC 7.6  4.0 - 10.5 K/uL   RBC 4.45  3.87 - 5.11 MIL/uL   Hemoglobin 11.4 (*) 12.0 - 15.0 g/dL   HCT 35.6 (*) 36.0 - 46.0 %   MCV 80.0  78.0 - 100.0 fL   MCH 25.6 (*) 26.0 - 34.0 pg   MCHC 32.0  30.0 - 36.0 g/dL   RDW 17.2 (*) 11.5 - 15.5 %   Platelets 359  150 - 400 K/uL   Neutrophils Relative % 70  43 - 77 %   Neutro Abs 5.4  1.7 - 7.7 K/uL   Lymphocytes Relative 20  12 - 46 %   Lymphs Abs 1.5  0.7 - 4.0 K/uL   Monocytes Relative 6  3 - 12 %   Monocytes Absolute 0.5  0.1 - 1.0 K/uL   Eosinophils Relative 3  0 - 5 %   Eosinophils  Absolute 0.2  0.0 - 0.7 K/uL   Basophils Relative 1  0 - 1 %   Basophils Absolute 0.0  0.0 - 0.1 K/uL  COMPREHENSIVE METABOLIC PANEL     Status: Abnormal   Collection Time    06/01/14  4:23 PM      Result Value Ref Range   Sodium 140  137 - 147 mEq/L   Potassium 4.0  3.7 - 5.3 mEq/L   Chloride 101  96 - 112 mEq/L   CO2 27  19 - 32 mEq/L   Glucose, Bld 94  70 - 99 mg/dL   BUN 6  6 - 23 mg/dL   Creatinine, Ser 0.76  0.50 - 1.10 mg/dL   Calcium 9.1  8.4 - 10.5 mg/dL   Total Protein 7.5  6.0 - 8.3 g/dL   Albumin 3.5  3.5 - 5.2 g/dL   AST 17  0 - 37 U/L   ALT 12  0 - 35 U/L  Alkaline Phosphatase 82  39 - 117 U/L   Total Bilirubin 0.2 (*) 0.3 - 1.2 mg/dL   GFR calc non Af Amer >90  >90 mL/min   GFR calc Af Amer >90  >90 mL/min   Comment: (NOTE)     The eGFR has been calculated using the CKD EPI equation.     This calculation has not been validated in all clinical situations.     eGFR's persistently <90 mL/min signify possible Chronic Kidney     Disease.   Ct Soft Tissue Neck W Contrast  06/01/2014   CLINICAL DATA:  Dental infection.  EXAM: CT NECK WITH CONTRAST  TECHNIQUE: Multidetector CT imaging of the neck was performed using the standard protocol following the bolus administration of intravenous contrast.  CONTRAST:  21m OMNIPAQUE IOHEXOL 300 MG/ML  SOLN  COMPARISON:  CT 05/29/2014.  FINDINGS: Visualized orbits, paranasal sinuses, and mastoids are clear. Skull base is unremarkable. Nasopharynx is clear. Oropharynx is clear. The larynx is normal. Trachea is normal. Thyroid is normal. Odontogenic disease is again noted about the right mandible. Previously identified right facial swelling/facial cellulitis in this region has partial cleared. Right submandibular adenopathy has increased slightly, the largest node now measures 17 x 11 mm, up from 14 x 9 mm. Adjacent smaller nodes are present in this region. These are most likely inflammatory. The tongue is intact. Parapharyngeal spaces are  unremarkable. Parotid and submandibular glands are normal. Left submandibular, bilateral anterior cervical small lymph nodes are again noted. Vascular structures in the neck are normal.  A right apical ill-defined 10 mm pulmonary density is present. This could be focal area of infiltrate. A lung cancer could present in this fashion. This is seen on image number 102/series 3. Chest CT is suggested for further evaluation. No acute bony abnormality identified. Retropharyngeal space is unremarkable.  IMPRESSION: 1. Persistent changes of odontogenic infection right posterior mandible. Previously identified adjacent right soft tissue phlegmon/cellulitis appears to have improved. No definite abscess is identified. Right submandibular adenopathy has increased slightly in size. 2. Ill-defined 10 mm poorly circumscribed nodule right upper lung. This could represent a lung cancer. Chest CT suggested for further evaluation .   Electronically Signed   By: TMarcello Moores Register   On: 06/01/2014 17:26   Ct Chest Wo Contrast  06/01/2014   CLINICAL DATA:  Right lung nodule seen on recent neck CT.  EXAM: CT CHEST WITHOUT CONTRAST  TECHNIQUE: Multidetector CT imaging of the chest was performed following the standard protocol without IV contrast.  COMPARISON:  None.  FINDINGS: A persistent ill-defined nodular density is seen in the posterior right upper lobe on image 19, which measures 8 mm and has a sub solid appearance. This characteristic as well as patient age favor inflammatory or infectious process over neoplasm. No other suspicious pulmonary nodules or masses are identified. No evidence of pulmonary airspace disease or central endobronchial lesion.  No evidence of pleural or pericardial effusion. No evidence of hilar or mediastinal masses. No adenopathy seen elsewhere within the thorax. Both adrenal glands are normal in appearance. Prior left nephrectomy noted. No suspicious bone lesions identified.  IMPRESSION: 8 mm subsolid  pulmonary nodule in the posterior right upper lobe. Although nonspecific, imaging characteristics and patient age favor an inflammatory or infectious process over neoplasm. Initial followup by chest CT recommended in 3 months. This recommendation follows the consensus statement: Recommendations for the Management of Subsolid Pulmonary Nodules Detected at CT: A Statement from the [facility] [id number]  Electronically Signed   By: Earle Gell M.D.   On: 06/01/2014 18:10    ROS: not obtained  Blood pressure 145/85, pulse 93, temperature 98.6 F (37 C), temperature source Oral, resp. rate 20, height 5' 3" (1.6 m), weight 65.772 kg (145 lb), last menstrual period 05/22/2014, SpO2 100.00%.  PHYSICAL EXAM: Overall appearance:  Distressed.  Not warm to touch.  Voice clear.  No obvious trismus.  No stridor. Head:  NCAT Ears:  clear Nose:  Smoke staining Oral Cavity:  Teeth in terrible repair. RIGHT mandib molar with large carious cavity, and RIGHT mandib pre molar also carious, neither sensitive to percussion.  Tongue mobile and non-swollen.  RIGHT FOM tender and indurated, but without translucent edema and no elevation of FOM or tongue.  LEFT FOM nl.  OP clear. Mild fetor oris.  No "hot potato" voice.  No trismus.    Oral Pharynx/Hypopharynx/Larynx:  OP clear.  Did not examine HP, Larynx Neuro: grossly intact Neck: Tender induration RIGHT level Ia, Ib, but no overlying skin changes.  Mild-mod swelling.    Studies Reviewed:  CT neck 14 and 20 JUN   Assessment/Plan Soft tissue cellulitis/phlegmon, RIGHT FOM and anterior neck.  No distortion of tongue.  No current airway issues.  Terrible teeth overall, but no focal tenderness.  Will admit for careful observation, IV Clindamycin.  Do not feel she needs surgical exploration or surgical airway tonight.   Discussed with patient and ?parents, who understand and agree.   Jodi Marble 2/42/3536, 8:40 PM

## 2014-06-01 NOTE — ED Notes (Signed)
[facility].

## 2014-06-01 NOTE — ED Provider Notes (Signed)
CSN: [id number]     Arrival date & time 06/01/14  1417 History   This chart was scribed for Glynn OctaveStephen Rancour, MD by Milly JakobJohn Lee Graves, ED Scribe. The patient was seen in room APA19/APA19. Patient's care was started at 4:30 PM.   Chief Complaint  Patient presents with  . Dysphagia    The history is provided by the patient. No language interpreter was used.   HPI Comments: Brittany [name] is a 28 y.o. female who presents to the Emergency Department complaining of complaining of constant, moderate dysphagia due to an abscess onset 4 days ago. She states that she was seen here for an abscess due to a dental Fx 3 days ago. Patient states that the swelling has moved from her upper jaw down to her lower jaw and throat over the past three days. She reports taht the area is harder and that she has been drooling. She states that she was prescribed ABX and has taken them for the past 3 days, but has been opening the capsules due to difficulty swallowing with her abscess. She denies fever vomiting, chest pain and abdominal pain. She reports a history of donating a kidney. She denies diabetes and other medical problems. Patient denies potential pregnancy and states they tested her 3 days ago.  History reviewed. No pertinent past medical history. Past Surgical History  Procedure Laterality Date  . Nephrectomy living donor     Family History  Problem Relation Age of Onset  . Cancer Other    History  Substance Use Topics  . Smoking status: Current Every Day Smoker -- 0.50 packs/day for 3 years    Types: Cigarettes  . Smokeless tobacco: Never Used  . Alcohol Use: No   OB History   Grav Para Term Preterm Abortions TAB SAB Ect Mult Living   2 2 2       2      Review of Systems  A complete 10 system review of systems was obtained and all systems are negative except as noted in the HPI and PMH.    Allergies  Ibuprofen  Home Medications   Prior to Admission medications   Medication Sig Start Date  End Date Taking? Authorizing Provider  acetaminophen (TYLENOL) 500 MG tablet Take 1,000 mg by mouth 4 (four) times daily as needed for mild pain. For pain   Yes Historical Provider, MD  clindamycin (CLEOCIN) 150 MG capsule Take 1 capsule (150 mg total) by mouth every 6 (six) hours. 05/29/14  Yes Burgess AmorJulie Idol, PA-C  oxyCODONE-acetaminophen (PERCOCET/ROXICET) 5-325 MG per tablet Take 1 tablet by mouth every 4 (four) hours as needed for moderate pain or severe pain. 05/29/14  Yes Burgess AmorJulie Idol, PA-C   Triage Vitals: BP 143/90  Pulse 116  Temp(Src) 98.2 F (36.8 C) (Oral)  Resp 16  Ht 5\' 3"  (1.6 m)  Wt 145 lb (65.772 kg)  BMI 25.69 kg/m2  SpO2 98%  LMP 05/22/2014 Physical Exam  Nursing note and vitals reviewed. Constitutional: She is oriented to person, place, and time. She appears well-developed and well-nourished. No distress.  HENT:  Head: Normocephalic and atraumatic.  Mouth/Throat: Oropharynx is clear and moist. No oropharyngeal exudate.  Airway intact. Swelling to her right mandible with firm edema to the right floor of the mouth and submental area.  There is no assymetry. Poor dentition throughout, with no obvious abscess on the right mandible.   Eyes: Conjunctivae and EOM are normal. Pupils are equal, round, and reactive to light.  Neck:  Normal range of motion. Neck supple.  No meningismus.  Cardiovascular: Normal rate, regular rhythm, normal heart sounds and intact distal pulses.   No murmur heard. Pulmonary/Chest: Effort normal and breath sounds normal. No stridor. No respiratory distress. She exhibits no tenderness.  Abdominal: Soft. There is no tenderness. There is no rebound and no guarding.  Musculoskeletal: Normal range of motion. She exhibits no edema and no tenderness.  Neurological: She is alert and oriented to person, place, and time. No cranial nerve deficit. She exhibits normal muscle tone. Coordination normal.  No ataxia on finger to nose bilaterally. No pronator drift. 5/5  strength throughout. CN 2-12 intact. Negative Romberg. Equal grip strength. Sensation intact. Gait is normal.   Skin: Skin is warm.  Psychiatric: She has a normal mood and affect. Her behavior is normal.    ED Course  Procedures (including critical care time) DIAGNOSTIC STUDIES: Oxygen Saturation is 98% on room air, normal by my interpretation.    COORDINATION OF CARE: 4:36 PM-Discussed treatment plan which includes a CAT scan and ABX with pt at bedside and pt agreed to plan.   Medications  dextrose 5 %-0.45 % sodium chloride infusion ( Intravenous New Bag/Given 06/01/14 2240)  morphine 2 MG/ML injection 2-4 mg (4 mg Intravenous Given 06/01/14 2348)  ondansetron (ZOFRAN) tablet 4 mg ( Oral See Alternative 06/01/14 2048)    Or  ondansetron (ZOFRAN) injection 4 mg (4 mg Intravenous Given 06/01/14 2048)  clindamycin (CLEOCIN) IVPB 600 mg (not administered)  chlorhexidine (PERIDEX) 0.12 % solution 5 mL (5 mLs Mouth/Throat Given 06/01/14 2132)  oxyCODONE (ROXICODONE) 5 MG/5ML solution 5-10 mg (10 mg Oral Given 06/01/14 2132)  clindamycin (CLEOCIN) IVPB 600 mg (0 mg Intravenous Stopped 06/01/14 1828)  morphine 4 MG/ML injection 4 mg (4 mg Intravenous Given 06/01/14 1648)  ondansetron (ZOFRAN) injection 4 mg (4 mg Intravenous Given 06/01/14 1647)  sodium chloride 0.9 % bolus 1,000 mL (0 mLs Intravenous Stopped 06/01/14 1849)  iohexol (OMNIPAQUE) 300 MG/ML solution 80 mL (80 mLs Intravenous Contrast Given 06/01/14 1702)  morphine 4 MG/ML injection 4 mg (4 mg Intravenous Given 06/01/14 1828)    Labs Review Labs Reviewed  CBC WITH DIFFERENTIAL - Abnormal; Notable for the following:    Hemoglobin 11.4 (*)    HCT 35.6 (*)    MCH 25.6 (*)    RDW 17.2 (*)    All other components within normal limits  COMPREHENSIVE METABOLIC PANEL - Abnormal; Notable for the following:    Total Bilirubin 0.2 (*)    All other components within normal limits  CULTURE, BLOOD (ROUTINE X 2)  CULTURE, BLOOD (ROUTINE X 2)   MRSA PCR SCREENING    Imaging Review Ct Soft Tissue Neck W Contrast  06/01/2014   CLINICAL DATA:  Dental infection.  EXAM: CT NECK WITH CONTRAST  TECHNIQUE: Multidetector CT imaging of the neck was performed using the standard protocol following the bolus administration of intravenous contrast.  CONTRAST:  80mL OMNIPAQUE IOHEXOL 300 MG/ML  SOLN  COMPARISON:  CT 05/29/2014.  FINDINGS: Visualized orbits, paranasal sinuses, and mastoids are clear. Skull base is unremarkable. Nasopharynx is clear. Oropharynx is clear. The larynx is normal. Trachea is normal. Thyroid is normal. Odontogenic disease is again noted about the right mandible. Previously identified right facial swelling/facial cellulitis in this region has partial cleared. Right submandibular adenopathy has increased slightly, the largest node now measures 17 x 11 mm, up from 14 x 9 mm. Adjacent smaller nodes are present in this region. These are  most likely inflammatory. The tongue is intact. Parapharyngeal spaces are unremarkable. Parotid and submandibular glands are normal. Left submandibular, bilateral anterior cervical small lymph nodes are again noted. Vascular structures in the neck are normal.  A right apical ill-defined 10 mm pulmonary density is present. This could be focal area of infiltrate. A lung cancer could present in this fashion. This is seen on image number 102/series 3. Chest CT is suggested for further evaluation. No acute bony abnormality identified. Retropharyngeal space is unremarkable.  IMPRESSION: 1. Persistent changes of odontogenic infection right posterior mandible. Previously identified adjacent right soft tissue phlegmon/cellulitis appears to have improved. No definite abscess is identified. Right submandibular adenopathy has increased slightly in size. 2. Ill-defined 10 mm poorly circumscribed nodule right upper lung. This could represent a lung cancer. Chest CT suggested for further evaluation .   Electronically Signed    By: Maisie Fus  Register   On: 06/01/2014 17:26   Ct Chest Wo Contrast  06/01/2014   CLINICAL DATA:  Right lung nodule seen on recent neck CT.  EXAM: CT CHEST WITHOUT CONTRAST  TECHNIQUE: Multidetector CT imaging of the chest was performed following the standard protocol without IV contrast.  COMPARISON:  None.  FINDINGS: A persistent ill-defined nodular density is seen in the posterior right upper lobe on image 19, which measures 8 mm and has a sub solid appearance. This characteristic as well as patient age favor inflammatory or infectious process over neoplasm. No other suspicious pulmonary nodules or masses are identified. No evidence of pulmonary airspace disease or central endobronchial lesion.  No evidence of pleural or pericardial effusion. No evidence of hilar or mediastinal masses. No adenopathy seen elsewhere within the thorax. Both adrenal glands are normal in appearance. Prior left nephrectomy noted. No suspicious bone lesions identified.  IMPRESSION: 8 mm subsolid pulmonary nodule in the posterior right upper lobe. Although nonspecific, imaging characteristics and patient age favor an inflammatory or infectious process over neoplasm. Initial followup by chest CT recommended in 3 months. This recommendation follows the consensus statement: Recommendations for the Management of Subsolid Pulmonary Nodules Detected at CT: A Statement from the Fleischner Society. Radiology 2013;266:304-317.   Electronically Signed   By: Myles Rosenthal M.D.   On: 06/01/2014 18:10     EKG Interpretation None      MDM   Final diagnoses:  Ludwig's angina   Patient was seen four days ago with swelling to her right jaw and given antibiotics. She returns with worsening swelling in her right jaw and difficulty swallowing.  Her oropharynx is clear. She has no trismus. There is tenderness and induration along her right mandible and floor of mouth on the right side.  Concern for possible Ludwig's angina. CT scan today  shows persistent changes with improvement of cellulitis component. No definite abscess. Discussed with on-call ENT Dr. Lazarus Salines as oral surgery is not available.  CT images reviewed with Dr. Lazarus Salines. He will evaluate patient at [facility] for concern for Ludwig angina. No evidence of abscess. Airway stable. Patient informed of lung nodule finding and need for repeat CT scan in 3 months.  Dr. Fayrene Fearing accepts patient to [facility] ED. Airway stable at time of transfer.  Glynn Octave, MD 06/02/14 [phone]

## 2014-06-01 NOTE — ED Notes (Signed)
[facility] left with patient at this time. No distress upon departure.

## 2014-06-02 ENCOUNTER — Encounter (HOSPITAL_COMMUNITY)
Admission: EM | Disposition: A | Discharge status: Home / Self Care | Payer: Self-pay | Source: Home / Self Care | Attending: Otolaryngology

## 2014-06-02 ENCOUNTER — Encounter (HOSPITAL_COMMUNITY): Payer: Self-pay | Admitting: Certified Registered"

## 2014-06-02 ENCOUNTER — Inpatient Hospital Stay (HOSPITAL_COMMUNITY): Payer: Self-pay | Admitting: Certified Registered"

## 2014-06-02 HISTORY — PX: TONSILLECTOMY: SHX5217

## 2014-06-02 LAB — MRSA PCR SCREENING: MRSA by PCR: POSITIVE — AB

## 2014-06-02 SURGERY — TONSILLECTOMY
Anesthesia: General | Site: Mouth | Laterality: Right

## 2014-06-02 MED ORDER — FENTANYL CITRATE 0.05 MG/ML IJ SOLN
INTRAMUSCULAR | Status: DC | PRN
  Administered 2014-06-02: 125 ug via INTRAVENOUS

## 2014-06-02 MED ORDER — PROPOFOL 10 MG/ML IV BOLUS
INTRAVENOUS | Status: DC | PRN
  Administered 2014-06-02: 170 mg via INTRAVENOUS

## 2014-06-02 MED ORDER — FENTANYL CITRATE 0.05 MG/ML IJ SOLN
INTRAMUSCULAR | Status: AC
  Filled 2014-06-02: qty 5

## 2014-06-02 MED ORDER — 0.9 % SODIUM CHLORIDE (POUR BTL) OPTIME
TOPICAL | Status: DC | PRN
  Administered 2014-06-02: 1000 mL

## 2014-06-02 MED ORDER — PROPOFOL 10 MG/ML IV BOLUS
INTRAVENOUS | Status: AC
  Filled 2014-06-02: qty 20

## 2014-06-02 MED ORDER — SUCCINYLCHOLINE CHLORIDE 20 MG/ML IJ SOLN
INTRAMUSCULAR | Status: DC | PRN
  Administered 2014-06-02: 100 mg via INTRAVENOUS

## 2014-06-02 MED ORDER — HYDROMORPHONE HCL PF 1 MG/ML IJ SOLN
0.2500 mg | INTRAMUSCULAR | Status: DC | PRN
  Administered 2014-06-02 (×4): 0.5 mg via INTRAVENOUS

## 2014-06-02 MED ORDER — OXYCODONE HCL 5 MG/5ML PO SOLN
ORAL | Status: AC
  Administered 2014-06-02: 10 mg via ORAL
  Filled 2014-06-02: qty 10

## 2014-06-02 MED ORDER — LIDOCAINE-EPINEPHRINE (PF) 1 %-1:200000 IJ SOLN
INTRAMUSCULAR | Status: DC | PRN
Start: 2014-06-02 — End: 2014-06-02
  Administered 2014-06-02: 2 mL

## 2014-06-02 MED ORDER — LACTATED RINGERS IV SOLN
INTRAVENOUS | Status: DC | PRN
  Administered 2014-06-02: 18:00:00 via INTRAVENOUS

## 2014-06-02 MED ORDER — ONDANSETRON HCL 4 MG/2ML IJ SOLN
INTRAMUSCULAR | Status: DC | PRN
  Administered 2014-06-02: 4 mg via INTRAVENOUS

## 2014-06-02 MED ORDER — MIDAZOLAM HCL 2 MG/2ML IJ SOLN
INTRAMUSCULAR | Status: AC
  Filled 2014-06-02: qty 2

## 2014-06-02 MED ORDER — LACTATED RINGERS IV SOLN
INTRAVENOUS | Status: DC
  Administered 2014-06-02: 17:00:00 via INTRAVENOUS

## 2014-06-02 MED ORDER — ONDANSETRON HCL 4 MG/2ML IJ SOLN: 4.0000 mg | Freq: Once | INTRAMUSCULAR | Status: DC | PRN

## 2014-06-02 MED ORDER — MIDAZOLAM HCL 2 MG/2ML IJ SOLN
INTRAMUSCULAR | Status: DC | PRN
  Administered 2014-06-02: 2 mg via INTRAVENOUS

## 2014-06-02 MED ORDER — HYDROMORPHONE HCL PF 1 MG/ML IJ SOLN
INTRAMUSCULAR | Status: AC
  Filled 2014-06-02: qty 2

## 2014-06-02 MED ORDER — LIDOCAINE HCL (CARDIAC) 20 MG/ML IV SOLN
INTRAVENOUS | Status: DC | PRN
  Administered 2014-06-02: 40 mg via INTRAVENOUS

## 2014-06-02 SURGICAL SUPPLY — 38 items
BLADE 10 SAFETY STRL DISP (BLADE) IMPLANT
CANISTER SUCTION 2500CC (MISCELLANEOUS) ×3 IMPLANT
CATH ROBINSON RED A/P 10FR (CATHETERS) IMPLANT
CLEANER TIP ELECTROSURG 2X2 (MISCELLANEOUS) ×3 IMPLANT
COAGULATOR SUCT 6 FR SWTCH (ELECTROSURGICAL)
COAGULATOR SUCT SWTCH 10FR 6 (ELECTROSURGICAL) IMPLANT
CRADLE DONUT ADULT HEAD (MISCELLANEOUS) IMPLANT
DRAIN PENROSE 1/4X12 LTX STRL (WOUND CARE) ×3 IMPLANT
ELECT COATED BLADE 2.86 ST (ELECTRODE) ×3 IMPLANT
ELECT REM PT RETURN 9FT ADLT (ELECTROSURGICAL) ×3
ELECT REM PT RETURN 9FT PED (ELECTROSURGICAL)
ELECTRODE REM PT RETRN 9FT PED (ELECTROSURGICAL) IMPLANT
ELECTRODE REM PT RTRN 9FT ADLT (ELECTROSURGICAL) ×1 IMPLANT
GAUZE SPONGE 4X4 16PLY XRAY LF (GAUZE/BANDAGES/DRESSINGS) ×3 IMPLANT
GLOVE BIO SURGEON STRL SZ7.5 (GLOVE) ×3 IMPLANT
GOWN STRL REUS W/ TWL LRG LVL3 (GOWN DISPOSABLE) ×2 IMPLANT
GOWN STRL REUS W/TWL LRG LVL3 (GOWN DISPOSABLE) ×4
KIT BASIN OR (CUSTOM PROCEDURE TRAY) ×3 IMPLANT
KIT ROOM TURNOVER OR (KITS) ×3 IMPLANT
NEEDLE HYPO 25GX1X1/2 BEV (NEEDLE) ×3 IMPLANT
NS IRRIG 1000ML POUR BTL (IV SOLUTION) ×3 IMPLANT
PACK SURGICAL SETUP 50X90 (CUSTOM PROCEDURE TRAY) ×3 IMPLANT
PAD ARMBOARD 7.5X6 YLW CONV (MISCELLANEOUS) ×6 IMPLANT
PENCIL BUTTON HOLSTER BLD 10FT (ELECTRODE) ×3 IMPLANT
SPECIMEN JAR SMALL (MISCELLANEOUS) IMPLANT
SPONGE TONSIL 1.25 RF SGL STRG (GAUZE/BANDAGES/DRESSINGS) IMPLANT
SUT SILK 2 0 SH (SUTURE) ×3 IMPLANT
SYR BULB 3OZ (MISCELLANEOUS) ×3 IMPLANT
SYR CONTROL 10ML LL (SYRINGE) ×3 IMPLANT
TOWEL OR 17X24 6PK STRL BLUE (TOWEL DISPOSABLE) ×6 IMPLANT
TUBE CONNECTING 12'X1/4 (SUCTIONS) ×1
TUBE CONNECTING 12X1/4 (SUCTIONS) ×2 IMPLANT
TUBE SALEM SUMP 10F W/ARV (TUBING) IMPLANT
TUBE SALEM SUMP 12R W/ARV (TUBING) IMPLANT
TUBE SALEM SUMP 14F W/ARV (TUBING) IMPLANT
TUBE SALEM SUMP 16 FR W/ARV (TUBING) IMPLANT
WATER STERILE IRR 1000ML POUR (IV SOLUTION) IMPLANT
YANKAUER SUCT BULB TIP NO VENT (SUCTIONS) ×3 IMPLANT

## 2014-06-02 NOTE — Anesthesia Procedure Notes (Signed)
Procedure Name: Intubation Date/Time: 06/02/2014 6:06 PM Performed by: Orvilla FusATO, SARAH A Pre-anesthesia Checklist: Timeout performed, Patient identified, Emergency Drugs available, Suction available and Patient being monitored Patient Re-evaluated:Patient Re-evaluated prior to inductionOxygen Delivery Method: Circle system utilized Preoxygenation: Pre-oxygenation with 100% oxygen Intubation Type: IV induction, Rapid sequence and Cricoid Pressure applied Laryngoscope Size: Mac and 3 Grade View: Grade I Tube type: Oral Tube size: 7.0 mm Number of attempts: 1 Airway Equipment and Method: Stylet Placement Confirmation: breath sounds checked- equal and bilateral,  ETT inserted through vocal cords under direct vision and positive ETCO2 Secured at: 21 cm Tube secured with: Tape Dental Injury: Teeth and Oropharynx as per pre-operative assessment

## 2014-06-02 NOTE — Progress Notes (Signed)
UR Completed Brenda Graves-Bigelow, RN,BSN [phone]  

## 2014-06-02 NOTE — Op Note (Signed)
NAMBenjaman Lobe:  [name], Brittany              ACCOUNT NO.:  [id number]  MEDICAL RECORD NO.:  [id number]  LOCATION:  3S13C                        FACILITY:  [facility]  PHYSICIAN:  Antony Contraswight D Bates, MD     DATE OF BIRTH:  May 01, 1986  DATE OF PROCEDURE:  06/02/2014 DATE OF DISCHARGE:                              OPERATIVE REPORT   PREOPERATIVE DIAGNOSIS:  Right floor of mouth, odontogenic abscess.  POSTOPERATIVE DIAGNOSIS:  Right floor of mouth, odontogenic abscess.  PROCEDURE:  Incision and drainage of right odontogenic floor of mouth abscess.  SURGEON:  Antony Contraswight D Bates, MD  ANESTHESIA:  General endotracheal anesthesia.  COMPLICATIONS:  None.  INDICATIONS:  The patient is a 28 year old female with very poor dentition who presented to the emergency department with swelling and pain in the mouth and was found to have inflammatory changes associated with poor teeth.  The CT scan suggests an abscess cavity and she presents to the operating room for surgical management after seeing this significant progress on IV antibiotics.  FINDINGS:  There was a wound in the right floor of mouth with pus spontaneously draining at the time of surgery.  Incision was made lateral to that wound and carried down along the medial surface of the mandible and there was not a significant flow of pus.  Cultures were taken from the depth of the wound.  DESCRIPTION OF PROCEDURE:  The patient was identified in the holding room, informed consent having been obtained.  Discussion of risks, benefits, alternatives, the patient was brought to the operative suite and put in supine position.  Anesthesia was induced.  The patient was intubated by Anesthesia team without difficulty.  The patient was already on intravenous clindamycin.  The eyes taped closed and bed was turned 90 degrees from anesthesia.  The patient was draped.  The floor of mouth on the right side was injected with 1% lidocaine with 1:200,000 epinephrine.  An  incision was made in the floor of mouth mucosa just inside the mandible using a Bovie electrocautery.  This was extended then deeply using blunt dissection along the medial wall of the mandible.  This was in connection with the spontaneous wound drainage. There was not a significant flow of pus as most of it has been already drained out.  The wound was then swabbed and sent for culture.  The wound was copiously irrigated with saline.  A 0.25-inch Penrose drain was then placed in the depth of the wound and secured with the mucosa using 2-0 silk suture.  The patient was then returned to anesthesia for wake up, was extubated, moved to recovery room in stable condition.     Antony Contraswight D Bates, MD     DDB/MEDQ  D:  06/02/2014  T:  06/02/2014  Job:  [id number]

## 2014-06-02 NOTE — Anesthesia Postprocedure Evaluation (Signed)
  Anesthesia Post-op Note  Patient: Brittany [name]  Procedure(s) Performed: Procedure(s): Irrigation and Drainage Right Floor of Mouth Dental Abscess (Right)  Patient Location: PACU  Anesthesia Type:General  Level of Consciousness: awake, alert , oriented and patient cooperative  Airway and Oxygen Therapy: Patient Spontanous Breathing  Post-op Pain: mild  Post-op Assessment: Post-op Vital signs reviewed, Patient's Cardiovascular Status Stable, Respiratory Function Stable, Patent Airway, No signs of Nausea or vomiting and Pain level controlled  Post-op Vital Signs: Reviewed and stable  Last Vitals:  Filed Vitals:   06/02/14 1915  BP: 128/77  Pulse:   Temp:   Resp:     Complications: No apparent anesthesia complications

## 2014-06-02 NOTE — Transfer of Care (Signed)
Immediate Anesthesia Transfer of Care Note  Patient: Brittany HawthorneCourtney L Kendle  Procedure(s) Performed: Procedure(s): Irrigation and Drainage Right Floor of Mouth Dental Abscess (Right)  Patient Location: PACU  Anesthesia Type:General  Level of Consciousness: awake, alert  and oriented  Airway & Oxygen Therapy: Patient Spontanous Breathing and Patient connected to nasal cannula oxygen  Post-op Assessment: Report given to PACU RN, Post -op Vital signs reviewed and stable and Patient moving all extremities X 4  Post vital signs: Reviewed and stable  Complications: No apparent anesthesia complications

## 2014-06-02 NOTE — Anesthesia Preprocedure Evaluation (Addendum)
Anesthesia Evaluation  Patient identified by MRN, date of birth, ID band Patient awake    Reviewed: Allergy & Precautions, H&P , NPO status , Patient's Chart, lab work & pertinent test results  History of Anesthesia Complications Negative for: history of anesthetic complications  Airway Mallampati: II TM Distance: >3 FB Neck ROM: Full    Dental  (+) Poor Dentition, Dental Advisory Given, Teeth Intact   Pulmonary Current Smoker,  breath sounds clear to auscultation        Cardiovascular Exercise Tolerance: Good Rhythm:Regular Rate:Normal     Neuro/Psych    GI/Hepatic   Endo/Other    Renal/GU Nephrectomy living donor     Musculoskeletal   Abdominal   Peds  Hematology   Anesthesia Other Findings   Reproductive/Obstetrics                          Anesthesia Physical Anesthesia Plan  ASA: III and emergent  Anesthesia Plan: General   Post-op Pain Management:    Induction: Intravenous  Airway Management Planned: Oral ETT  Additional Equipment:   Intra-op Plan:   Post-operative Plan: Extubation in OR  Informed Consent: I have reviewed the patients History and Physical, chart, labs and discussed the procedure including the risks, benefits and alternatives for the proposed anesthesia with the patient or authorized representative who has indicated his/her understanding and acceptance.   Dental advisory given  Plan Discussed with: CRNA and Anesthesiologist  Anesthesia Plan Comments: (Poor dentition with submandibular abscess Smoker  Plan GA with RSI  Kipp Broodavid Joslin, MD)       Anesthesia Quick Evaluation

## 2014-06-02 NOTE — Progress Notes (Signed)
06/02/2014 1:15 PM  Kae HellerSmith, Saidah [id number]  Hosp  Day 2    Temp:  [97.8 F (36.6 C)-99.3 F (37.4 C)] 97.8 F (36.6 C) (06/18 0800) Pulse Rate:  [91-116] 110 (06/18 0409) Resp:  [14-20] 16 (06/18 0409) BP: (115-145)/(65-97) 115/77 mmHg (06/18 0800) SpO2:  [97 %-100 %] 97 % (06/18 0409) Weight:  [65.772 kg (145 lb)-65.8 kg (145 lb 1 oz)] 65.8 kg (145 lb 1 oz) (06/17 2252),     Intake/Output Summary (Last 24 hours) at 06/02/14 1315 Last data filed at 06/02/14 0700  Gross per 24 hour  Intake    975 ml  Output      0 ml  Net    975 ml    Results for orders placed during the hospital encounter of 06/01/14 (from the past 24 hour(s))  CBC WITH DIFFERENTIAL     Status: Abnormal   Collection Time    06/01/14  4:23 PM      Result Value Ref Range   WBC 7.6  4.0 - 10.5 K/uL   RBC 4.45  3.87 - 5.11 MIL/uL   Hemoglobin 11.4 (*) 12.0 - 15.0 g/dL   HCT 78.235.6 (*) 95.636.0 - 21.346.0 %   MCV 80.0  78.0 - 100.0 fL   MCH 25.6 (*) 26.0 - 34.0 pg   MCHC 32.0  30.0 - 36.0 g/dL   RDW 08.617.2 (*) 57.811.5 - 46.915.5 %   Platelets 359  150 - 400 K/uL   Neutrophils Relative % 70  43 - 77 %   Neutro Abs 5.4  1.7 - 7.7 K/uL   Lymphocytes Relative 20  12 - 46 %   Lymphs Abs 1.5  0.7 - 4.0 K/uL   Monocytes Relative 6  3 - 12 %   Monocytes Absolute 0.5  0.1 - 1.0 K/uL   Eosinophils Relative 3  0 - 5 %   Eosinophils Absolute 0.2  0.0 - 0.7 K/uL   Basophils Relative 1  0 - 1 %   Basophils Absolute 0.0  0.0 - 0.1 K/uL  COMPREHENSIVE METABOLIC PANEL     Status: Abnormal   Collection Time    06/01/14  4:23 PM      Result Value Ref Range   Sodium 140  137 - 147 mEq/L   Potassium 4.0  3.7 - 5.3 mEq/L   Chloride 101  96 - 112 mEq/L   CO2 27  19 - 32 mEq/L   Glucose, Bld 94  70 - 99 mg/dL   BUN 6  6 - 23 mg/dL   Creatinine, Ser 6.290.76  0.50 - 1.10 mg/dL   Calcium 9.1  8.4 - 52.810.5 mg/dL   Total Protein 7.5  6.0 - 8.3 g/dL   Albumin 3.5  3.5 - 5.2 g/dL   AST 17  0 - 37 U/L   ALT 12  0 - 35 U/L   Alkaline  Phosphatase 82  39 - 117 U/L   Total Bilirubin 0.2 (*) 0.3 - 1.2 mg/dL   GFR calc non Af Amer >90  >90 mL/min   GFR calc Af Amer >90  >90 mL/min  CULTURE, BLOOD (ROUTINE X 2)     Status: None   Collection Time    06/01/14  4:24 PM      Result Value Ref Range   Specimen Description BLOOD LEFT ANTECUBITAL     Special Requests BOTTLES DRAWN AEROBIC AND ANAEROBIC 8CC     Culture NO GROWTH 1 DAY  Report Status PENDING    CULTURE, BLOOD (ROUTINE X 2)     Status: None   Collection Time    06/01/14  4:39 PM      Result Value Ref Range   Specimen Description BLOOD RIGHT ANTECUBITAL DRAWN BY RN     Special Requests BOTTLES DRAWN AEROBIC AND ANAEROBIC 8CC     Culture NO GROWTH 1 DAY     Report Status PENDING    MRSA PCR SCREENING     Status: Abnormal   Collection Time    06/01/14 11:03 PM      Result Value Ref Range   MRSA by PCR POSITIVE (*) NEGATIVE    SUBJECTIVE:  Some better this AM, but still very tender RIGHT ant FOM.  OBJECTIVE:  Fluctuant swelling RIGHT FOM.  Sl less tender sub mental  IMPRESSION:  Developing FOM dental abscess  PLAN:  Needs I&D.  Hopefully can do this today.  Cont IV abx  Flo ShanksWOLICKI, Bexleigh Theriault

## 2014-06-02 NOTE — Brief Op Note (Signed)
06/01/2014 - 06/02/2014  6:36 PM  PATIENT:  Brittany [name]  28 y.o. female  PRE-OPERATIVE DIAGNOSIS:  Right Floor of Mouth Abscess  POST-OPERATIVE DIAGNOSIS:  same  PROCEDURE:  Procedure(s): Irrigation and Drainage Right Floor of Mouth Dental Abscess (Right)  SURGEON:  Surgeon(s) and Role:    * Christia Readingwight Jenasia Dolinar, MD - Primary  PHYSICIAN ASSISTANT:   ASSISTANTS: none   ANESTHESIA:   general  EBL:  Total I/O In: 400 [I.V.:400] Out: -   BLOOD ADMINISTERED:none  DRAINS: Penrose drain in the right floor of mouth   LOCAL MEDICATIONS USED:  LIDOCAINE   SPECIMEN:  Source of Specimen:  culture from right floor of mouth wound  DISPOSITION OF SPECIMEN:  Microbiology  COUNTS:  YES  TOURNIQUET:  * No tourniquets in log *  DICTATION: .Other Dictation: Dictation Number [id number]  PLAN OF CARE: Return to hospital room  PATIENT DISPOSITION:  PACU - hemodynamically stable.   Delay start of Pharmacological VTE agent (>24hrs) due to surgical blood loss or risk of bleeding: no

## 2014-06-03 MED ORDER — OXYCODONE-ACETAMINOPHEN 5-325 MG PO TABS: 1.0000 | ORAL_TABLET | ORAL | Status: DC | PRN

## 2014-06-03 MED ORDER — MUPIROCIN 2 % EX OINT
1.0000 "application " | TOPICAL_OINTMENT | Freq: Two times a day (BID) | CUTANEOUS | Status: DC
  Administered 2014-06-04 (×2): 1 via NASAL
  Filled 2014-06-03: qty 22

## 2014-06-03 MED ORDER — CHLORHEXIDINE GLUCONATE 0.12 % MT SOLN: 5.0000 mL | Freq: Four times a day (QID) | OROMUCOSAL | Status: DC

## 2014-06-03 MED ORDER — CHLORHEXIDINE GLUCONATE CLOTH 2 % EX PADS
6.0000 | MEDICATED_PAD | Freq: Every day | CUTANEOUS | Status: DC
Start: 2014-06-04 — End: 2014-06-04
  Administered 2014-06-04: 6 via TOPICAL

## 2014-06-03 MED ORDER — CLINDAMYCIN HCL 300 MG PO CAPS: 300.0000 mg | ORAL_CAPSULE | Freq: Three times a day (TID) | ORAL | Status: DC
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
## 2014-06-03 NOTE — Progress Notes (Signed)
06/03/2014 10:12 AM  Kae HellerSmith, Anwita [id number]  Post-Op Day 1    Temp:  [97.8 F (36.6 C)-100.2 F (37.9 C)] 98 F (36.7 C) (06/19 0750) Pulse Rate:  [85-111] 85 (06/19 0750) Resp:  [11-17] 17 (06/19 0750) BP: (114-136)/(72-84) 124/76 mmHg (06/19 0750) SpO2:  [95 %-100 %] 95 % (06/19 0750),     Intake/Output Summary (Last 24 hours) at 06/03/14 1012 Last data filed at 06/02/14 1825  Gross per 24 hour  Intake    400 ml  Output      0 ml  Net    400 ml    Results for orders placed during the hospital encounter of 06/01/14 (from the past 24 hour(s))  CULTURE, ROUTINE-ABSCESS     Status: None   Collection Time    06/02/14  6:44 PM      Result Value Ref Range   Specimen Description ABSCESS MOUTH     Special Requests NONE     Gram Stain       Value: FEW WBC PRESENT,BOTH PMN AND MONONUCLEAR     NO SQUAMOUS EPITHELIAL CELLS SEEN     RARE GRAM POSITIVE COCCI     IN PAIRS RARE GRAM POSITIVE RODS     RARE Y   Culture       Value: NO GROWTH     [facility]   Report Status PENDING    ANAEROBIC CULTURE     Status: None   Collection Time    06/02/14  6:44 PM      Result Value Ref Range   Specimen Description ABSCESS MOUTH     Special Requests NONE     Gram Stain       Value: FEW WBC PRESENT,BOTH PMN AND MONONUCLEAR     NO SQUAMOUS EPITHELIAL CELLS SEEN     RARE GRAM POSITIVE COCCI     IN PAIRS RARE GRAM POSITIVE RODS     RARE Y   Culture PENDING     Report Status PENDING      SUBJECTIVE:  Less pain.  Swallowing some liquids.  Breathing fine.  OBJECTIVE:   FOM less swollen.  Penrose drain in place.  Submental swelling less firm, less tender  IMPRESSION:  Improved odontogenic abscess  PLAN:  Continue IV abx x 24 hrs more.  Drain out and discharge home in AM. Rx's written for  Peridex, Clindamycin, Roxicet.    Flo ShanksWOLICKI, KAROL

## 2014-06-03 NOTE — Progress Notes (Signed)
Pt arrived back from PACU around 2100. Pt awake and alert, ice packs in place. VSS, drain in place in mouth. Pt ambulated to the bathroom with standby assist, minimal complaints of pain. Family at bedside, will continue monitoring

## 2014-06-04 NOTE — Discharge Summary (Signed)
Physician Discharge Summary  Patient ID: Brittany [name] MRN: [id number] DOB/AGE: January 26, 1986 28 y.o.  Admit date: 06/01/2014 Discharge date: 06/04/2014  Admission Diagnoses: Dental abscess  Discharge Diagnoses:  Active Problems:   Ludwig's angina   Discharged Condition: good  Hospital Course: No complications, improved dramatically after procedure.  Consults: none  Significant Diagnostic Studies: none  Treatments: surgery: Incision and drainage of floor of mouth abscess  Discharge Exam: Blood pressure 110/71, pulse 65, temperature 98.2 F (36.8 C), temperature source Oral, resp. rate 15, height 5\' 3"  (1.6 m), weight 145 lb 1 oz (65.8 kg), last menstrual period 05/22/2014, SpO2 100.00%. PHYSICAL EXAM: Swelling significantly reduced. Able to take oral diet.  Disposition: 01-Home or Self Care  Discharge Instructions   Diet - low sodium heart healthy    Complete by:  As directed      Increase activity slowly    Complete by:  As directed             Medication List         acetaminophen 500 MG tablet  Commonly known as:  TYLENOL  Take 1,000 mg by mouth 4 (four) times daily as needed for mild pain. For pain     chlorhexidine 0.12 % solution  Commonly known as:  PERIDEX  Use as directed 5 mLs in the mouth or throat 4 (four) times daily.     chlorhexidine 0.12 % solution  Commonly known as:  PERIDEX  Use as directed 5 mLs in the mouth or throat 4 (four) times daily.     clindamycin 150 MG capsule  Commonly known as:  CLEOCIN  Take 1 capsule (150 mg total) by mouth every 6 (six) hours.     clindamycin 300 MG capsule  Commonly known as:  CLEOCIN  Take 1 capsule (300 mg total) by mouth 3 (three) times daily.     oxyCODONE-acetaminophen 5-325 MG per tablet  Commonly known as:  PERCOCET/ROXICET  Take 1 tablet by mouth every 4 (four) hours as needed for moderate pain or severe pain.     oxyCODONE-acetaminophen 5-325 MG per tablet  Commonly known as:  ROXICET   Take 1-2 tablets by mouth every 4 (four) hours as needed for severe pain.           Follow-up Information   Follow up with Flo ShanksWOLICKI, KAROL, MD In 2 weeks.   Specialty:  Otolaryngology   Contact information:   [address] [place] [address] [phone]       Follow up with Flo ShanksWOLICKI, KAROL, MD. Call in 1 week.   Specialty:  Otolaryngology   Contact information:   [address] [place] [address] [phone]       Signed: Serena ColonelROSEN, JEFRY 06/04/2014, 7:52 AM

## 2014-06-04 NOTE — Progress Notes (Signed)
Subjective: She feels much better. She has been walking a lot. She has been drinking very well.  Objective: Vital signs in last 24 hours: Temp:  [98 F (36.7 C)-98.4 F (36.9 C)] 98.2 F (36.8 C) (06/20 0641) Pulse Rate:  [65-85] 65 (06/20 0641) Resp:  [11-17] 15 (06/20 0641) BP: (110-124)/(70-76) 110/71 mmHg (06/20 0641) SpO2:  [95 %-100 %] 100 % (06/20 0641) Weight change:  Last BM Date: 05/30/14  Intake/Output from previous day: 06/19 0701 - 06/20 0700 In: 3410 [P.O.:360; I.V.:3050] Out: -  Intake/Output this shift:    PHYSICAL EXAM: Right side floor of mouth drain easily removed as it was almost out. No purulent drainage. No edema of the floor of mouth or tongue tissues.  Lab Results:  Recent Labs  06/01/14 1623  WBC 7.6  HGB 11.4*  HCT 35.6*  PLT 359   BMET  Recent Labs  06/01/14 1623  NA 140  K 4.0  CL 101  CO2 27  GLUCOSE 94  BUN 6  CREATININE 0.76  CALCIUM 9.1    Studies/Results: No results found.  Medications: I have reviewed the patient's current medications.  Assessment/Plan: Doing much better. Discharge home today.  LOS: 3 days   ROSEN, JEFRY 06/04/2014, 7:49 AM

## 2014-06-04 NOTE — Progress Notes (Addendum)
Discharge instructions gone over with patient. Home medications gone over. Prescriptions given. Follow up appointment to be made. Diet, activity, and signs and symptoms of worsening condition gone over and what to do. Encouraged to seek dental treatment and to quit smoking. Patient verbalized understanding of instructions. Patient to continue mucipiron at home

## 2014-06-05 ENCOUNTER — Encounter (HOSPITAL_COMMUNITY): Payer: Self-pay | Admitting: Otolaryngology

## 2014-06-05 LAB — CULTURE, ROUTINE-ABSCESS

## 2014-06-06 LAB — CULTURE, BLOOD (ROUTINE X 2)
CULTURE: NO GROWTH
Culture: NO GROWTH

## 2014-06-07 LAB — ANAEROBIC CULTURE

## 2014-06-23 ENCOUNTER — Emergency Department (HOSPITAL_COMMUNITY): Payer: Self-pay

## 2014-06-23 ENCOUNTER — Encounter (HOSPITAL_COMMUNITY): Payer: Self-pay | Admitting: Emergency Medicine

## 2014-06-23 ENCOUNTER — Emergency Department (HOSPITAL_COMMUNITY)
Admission: EM | Admit: 2014-06-23 | Discharge: 2014-06-23 | Disposition: A | Discharge status: Home / Self Care | Payer: Self-pay | Attending: Emergency Medicine | Admitting: Emergency Medicine

## 2014-06-23 DIAGNOSIS — F17200 Nicotine dependence, unspecified, uncomplicated: Secondary | ICD-10-CM | POA: Insufficient documentation

## 2014-06-23 DIAGNOSIS — Y9389 Activity, other specified: Secondary | ICD-10-CM | POA: Insufficient documentation

## 2014-06-23 DIAGNOSIS — S0101XA Laceration without foreign body of scalp, initial encounter: Secondary | ICD-10-CM

## 2014-06-23 DIAGNOSIS — Y92410 Unspecified street and highway as the place of occurrence of the external cause: Secondary | ICD-10-CM | POA: Insufficient documentation

## 2014-06-23 DIAGNOSIS — Z79899 Other long term (current) drug therapy: Secondary | ICD-10-CM | POA: Insufficient documentation

## 2014-06-23 DIAGNOSIS — S0100XA Unspecified open wound of scalp, initial encounter: Secondary | ICD-10-CM | POA: Insufficient documentation

## 2014-06-23 MED ORDER — ONDANSETRON 8 MG PO TBDP
8.0000 mg | ORAL_TABLET | Freq: Once | ORAL | Status: AC
  Administered 2014-06-23: 8 mg via ORAL
  Filled 2014-06-23: qty 1

## 2014-06-23 MED ORDER — OXYCODONE-ACETAMINOPHEN 5-325 MG PO TABS
2.0000 | ORAL_TABLET | Freq: Once | ORAL | Status: AC
  Administered 2014-06-23: 2 via ORAL
  Filled 2014-06-23: qty 2

## 2014-06-23 MED ORDER — METAXALONE 800 MG PO TABS: 800.0000 mg | ORAL_TABLET | Freq: Three times a day (TID) | ORAL | Status: DC

## 2014-06-23 MED ORDER — OXYCODONE-ACETAMINOPHEN 5-325 MG PO TABS: 2.0000 | ORAL_TABLET | ORAL | Status: DC | PRN

## 2014-06-23 MED ORDER — ONDANSETRON 8 MG PO TBDP: 8.0000 mg | ORAL_TABLET | Freq: Three times a day (TID) | ORAL | Status: DC | PRN

## 2014-06-23 NOTE — Discharge Instructions (Signed)
Head Injury °You have received a head injury. It does not appear serious at this time. Headaches and vomiting are common following head injury. It should be easy to awaken from sleeping. Sometimes it is necessary for you to stay in the emergency department for a while for observation. Sometimes admission to the hospital may be needed. After injuries such as yours, most problems occur within the first 24 hours, but side effects may occur up to 7-10 days after the injury. It is important for you to carefully monitor your condition and contact your health care provider or seek immediate medical care if there is a change in your condition. °WHAT ARE THE TYPES OF HEAD INJURIES? °Head injuries can be as minor as a bump. Some head injuries can be more severe. More severe head injuries include: °· A jarring injury to the brain (concussion). °· A bruise of the brain (contusion). This mean there is bleeding in the brain that can cause swelling. °· A cracked skull (skull fracture). °· Bleeding in the brain that collects, clots, and forms a bump (hematoma). °WHAT CAUSES A HEAD INJURY? °A serious head injury is most likely to happen to someone who is in a car wreck and is not wearing a seat belt. Other causes of major head injuries include bicycle or motorcycle accidents, sports injuries, and falls. °HOW ARE HEAD INJURIES DIAGNOSED? °A complete history of the event leading to the injury and your current symptoms will be helpful in diagnosing head injuries. Many times, pictures of the brain, such as CT or MRI are needed to see the extent of the injury. Often, an overnight hospital stay is necessary for observation.  °WHEN SHOULD I SEEK IMMEDIATE MEDICAL CARE?  °You should get help right away if: °· You have confusion or drowsiness. °· You feel sick to your stomach (nauseous) or have continued, forceful vomiting. °· You have dizziness or unsteadiness that is getting worse. °· You have severe, continued headaches not relieved by  medicine. Only take over-the-counter or prescription medicines for pain, fever, or discomfort as directed by your health care provider. °· You do not have normal function of the arms or legs or are unable to walk. °· You notice changes in the black spots in the center of the colored part of your eye (pupil). °· You have a clear or bloody fluid coming from your nose or ears. °· You have a loss of vision. °During the next 24 hours after the injury, you must stay with someone who can watch you for the warning signs. This person should contact local emergency services (911 in the [place] if you have seizures, you become unconscious, or you are unable to wake up. °HOW CAN I PREVENT A HEAD INJURY IN THE FUTURE? °The most important factor for preventing major head injuries is avoiding motor vehicle accidents.  To minimize the potential for damage to your head, it is crucial to wear seat belts while riding in motor vehicles. Wearing helmets while bike riding and playing collision sports (like football) is also helpful. Also, avoiding dangerous activities around the house will further help reduce your risk of head injury.  °WHEN CAN I RETURN TO NORMAL ACTIVITIES AND ATHLETICS? °You should be reevaluated by your health care provider before returning to these activities. If you have any of the following symptoms, you should not return to activities or contact sports until 1 week after the symptoms have stopped: °· Persistent headache. °· Dizziness or vertigo. °· Poor attention and concentration. °· Confusion. °·   Memory problems.  Nausea or vomiting.  Fatigue or tire easily.  Irritability.  Intolerant of bright lights or loud noises.  Anxiety or depression.  Disturbed sleep. MAKE SURE YOU:   Understand these instructions.  Will watch your condition.  Will get help right away if you are not doing well or get worse. Document Released: 12/02/2005 Document Revised: 12/07/2013 Document Reviewed:  08/09/2013 [facility] Patient Information 2015 [place], [place]. This information is not intended to replace advice given to you by your health care provider. Make sure you discuss any questions you have with your health care provider.  Staple Care and Removal Your caregiver has used staples today to repair your wound. Staples are used to help a wound heal faster by holding the edges of the wound together. The staples can be removed when the wound has healed well enough to stay together after the staples are removed. A dressing (wound covering), depending on the location of the wound, may have been applied. This may be changed once per day or as instructed. If the dressing sticks, it may be soaked off with soapy water or hydrogen peroxide. Only take over-the-counter or prescription medicines for pain, discomfort, or fever as directed by your caregiver.  If you did not receive a tetanus shot today because you did not recall when your last one was given, check with your caregiver when you have your staples removed to determine if one is needed. Return to your caregiver's office in 1 week or as suggested to have your staples removed. SEEK IMMEDIATE MEDICAL CARE IF:   You have redness, swelling, or increasing pain in the wound.  You have pus coming from the wound.  You have a fever.  You notice a bad smell coming from the wound or dressing.  Your wound edges break open after staples have been removed. Document Released: 08/27/2001 Document Revised: 02/24/2012 Document Reviewed: 09/11/2005 [facility] Patient Information 2015 [place], [place]. This information is not intended to replace advice given to you by your health care provider. Make sure you discuss any questions you have with your health care provider. Please go to your Primary Care Physician, an Urgent Care or return to the Emergency Department to have your staples or sutures removed 7 -10 days from today.
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
## 2014-09-02 ENCOUNTER — Encounter (HOSPITAL_COMMUNITY): Payer: Self-pay | Admitting: Emergency Medicine

## 2014-09-02 ENCOUNTER — Emergency Department (HOSPITAL_COMMUNITY)
Admission: EM | Admit: 2014-09-02 | Discharge: 2014-09-02 | Disposition: A | Discharge status: Home / Self Care | Payer: No Typology Code available for payment source | Attending: Emergency Medicine | Admitting: Emergency Medicine

## 2014-09-02 DIAGNOSIS — Z79899 Other long term (current) drug therapy: Secondary | ICD-10-CM | POA: Insufficient documentation

## 2014-09-02 DIAGNOSIS — R51 Headache: Secondary | ICD-10-CM | POA: Insufficient documentation

## 2014-09-02 DIAGNOSIS — K047 Periapical abscess without sinus: Secondary | ICD-10-CM | POA: Insufficient documentation

## 2014-09-02 DIAGNOSIS — Z792 Long term (current) use of antibiotics: Secondary | ICD-10-CM | POA: Insufficient documentation

## 2014-09-02 DIAGNOSIS — F17200 Nicotine dependence, unspecified, uncomplicated: Secondary | ICD-10-CM | POA: Insufficient documentation

## 2014-09-02 MED ORDER — HYDROMORPHONE HCL 1 MG/ML IJ SOLN
1.0000 mg | Freq: Once | INTRAMUSCULAR | Status: AC
  Administered 2014-09-02: 1 mg via INTRAMUSCULAR
  Filled 2014-09-02: qty 1

## 2014-09-02 MED ORDER — PENICILLIN V POTASSIUM 500 MG PO TABS: 500.0000 mg | ORAL_TABLET | Freq: Four times a day (QID) | ORAL | Status: AC

## 2014-09-02 MED ORDER — OXYCODONE-ACETAMINOPHEN 5-325 MG PO TABS: 1.0000 | ORAL_TABLET | Freq: Four times a day (QID) | ORAL | Status: DC | PRN

## 2014-09-02 NOTE — Discharge Instructions (Signed)
Follow up with a dentist next week. °

## 2014-09-02 NOTE — ED Notes (Signed)
Dr Zammit at bedside. 

## 2014-09-02 NOTE — ED Notes (Addendum)
Pt. Reports pain to right side of face. Pt. Reports hx of similar pain due to abscess. No obvious swelling. No acute distress noted.

## 2014-09-05 NOTE — ED Provider Notes (Signed)
CSN: [id number]     Arrival date & time 09/02/14  0205 History   None    Chief Complaint  Patient presents with  . Facial Pain     (Consider location/radiation/quality/duration/timing/severity/associated sxs/prior Treatment) Patient is a 28 y.o. female presenting with tooth pain. The history is provided by the patient (the pt complains of pain and swelling to face).  Dental Pain Location:  Upper Upper teeth location: left upper. Quality:  Dull Severity:  Moderate Onset quality:  Sudden Timing:  Constant Progression:  Worsening Chronicity:  New Context: abscess   Associated symptoms: no congestion and no headaches     History reviewed. No pertinent past medical history. Past Surgical History  Procedure Laterality Date  . Nephrectomy living donor    . Tonsillectomy Right 06/02/2014    Procedure: Irrigation and Drainage Right Floor of Mouth Dental Abscess;  Surgeon: Christia Reading, MD;  Location: [facility] OR;  Service: ENT;  Laterality: Right;   Family History  Problem Relation Age of Onset  . Cancer Other    History  Substance Use Topics  . Smoking status: Current Every Day Smoker -- 0.50 packs/day for 3 years    Types: Cigarettes  . Smokeless tobacco: Never Used  . Alcohol Use: Yes     Comment: occ.   OB History   Grav Para Term Preterm Abortions TAB SAB Ect Mult Living   Review of Systems  Constitutional: Negative for appetite change and fatigue.  HENT: Negative for congestion, ear discharge and sinus pressure.        Facial swelling and pain  Eyes: Negative for discharge.  Respiratory: Negative for cough.   Cardiovascular: Negative for chest pain.  Gastrointestinal: Negative for abdominal pain and diarrhea.  Genitourinary: Negative for frequency and hematuria.  Musculoskeletal: Negative for back pain.  Skin: Negative for rash.  Neurological: Negative for seizures and headaches.  Psychiatric/Behavioral: Negative for hallucinations.       Allergies  Ibuprofen  Home Medications   Prior to Admission medications   Medication Sig Start Date End Date Taking? Authorizing Provider  acetaminophen (TYLENOL) 500 MG tablet Take 1,000 mg by mouth 4 (four) times daily as needed for mild pain. For pain   Yes Historical Provider, MD  chlorhexidine (PERIDEX) 0.12 % solution Use as directed 5 mLs in the mouth or throat 4 (four) times daily. 06/03/14   Flo Shanks, MD  metaxalone (SKELAXIN) 800 MG tablet Take 1 tablet (800 mg total) by mouth 3 (three) times daily. 06/23/14   Toy Baker, MD  ondansetron (ZOFRAN ODT) 8 MG disintegrating tablet Take 1 tablet (8 mg total) by mouth every 8 (eight) hours as needed for nausea or vomiting. 06/23/14   Toy Baker, MD  oxyCODONE-acetaminophen (PERCOCET/ROXICET) 5-325 MG per tablet Take 2 tablets by mouth every 4 (four) hours as needed for severe pain. 06/23/14   Toy Baker, MD  oxyCODONE-acetaminophen (PERCOCET/ROXICET) 5-325 MG per tablet Take 1 tablet by mouth every 6 (six) hours as needed. 09/02/14   Benny Lennert, MD  oxyCODONE-acetaminophen (ROXICET) 5-325 MG per tablet Take 1-2 tablets by mouth every 4 (four) hours as needed for severe pain. 06/03/14   Flo Shanks, MD  penicillin v potassium (VEETID) 500 MG tablet Take 1 tablet (500 mg total) by mouth 4 (four) times daily. 09/02/14 09/09/14  Benny Lennert, MD   BP 131/93  Pulse 113  Temp(Src) 98 F (36.7  C) (Oral)  Resp 16  Ht  (1.6 m)  Wt 135 lb (61.236 kg)  BMI 23.92 kg/m2  SpO2 98%  LMP 08/23/2014 Physical Exam  Constitutional: She is oriented to person, place, and time. She appears well-developed.  HENT:  Head: Normocephalic.  Swelling to left face with tender abscessed tooth  Eyes: Conjunctivae are normal.  Neck: No tracheal deviation present.  Cardiovascular:  No murmur heard. Musculoskeletal: Normal range of motion.  Neurological: She is oriented to person, place, and time.  Skin: Skin is warm.   Psychiatric: She has a normal mood and affect.    ED Course  Procedures (including critical care time) Labs Review Labs Reviewed - No data to display  Imaging Review No results found.   EKG Interpretation None      MDM   Final diagnoses:  Abscessed tooth    Pt txed with penn,  Oxy and follow up     Benny Lennert, MD 09/05/14 1044

## 2014-10-17 ENCOUNTER — Encounter (HOSPITAL_COMMUNITY): Payer: Self-pay | Admitting: Emergency Medicine

## 2014-12-08 ENCOUNTER — Encounter (HOSPITAL_COMMUNITY): Payer: Self-pay | Admitting: *Deleted

## 2014-12-08 ENCOUNTER — Emergency Department (HOSPITAL_COMMUNITY)
Admission: EM | Admit: 2014-12-08 | Discharge: 2014-12-08 | Disposition: A | Discharge status: Home / Self Care | Payer: Self-pay | Attending: Emergency Medicine | Admitting: Emergency Medicine

## 2014-12-08 ENCOUNTER — Emergency Department (HOSPITAL_COMMUNITY): Payer: Self-pay

## 2014-12-08 DIAGNOSIS — E876 Hypokalemia: Secondary | ICD-10-CM | POA: Insufficient documentation

## 2014-12-08 DIAGNOSIS — R220 Localized swelling, mass and lump, head: Secondary | ICD-10-CM

## 2014-12-08 DIAGNOSIS — R51 Headache: Secondary | ICD-10-CM

## 2014-12-08 DIAGNOSIS — Z79899 Other long term (current) drug therapy: Secondary | ICD-10-CM | POA: Insufficient documentation

## 2014-12-08 DIAGNOSIS — R519 Headache, unspecified: Secondary | ICD-10-CM

## 2014-12-08 DIAGNOSIS — Z720 Tobacco use: Secondary | ICD-10-CM | POA: Insufficient documentation

## 2014-12-08 DIAGNOSIS — L03211 Cellulitis of face: Secondary | ICD-10-CM | POA: Insufficient documentation

## 2014-12-08 DIAGNOSIS — Z3202 Encounter for pregnancy test, result negative: Secondary | ICD-10-CM | POA: Insufficient documentation

## 2014-12-08 LAB — CBC WITH DIFFERENTIAL/PLATELET
Basophils Absolute: 0.1 K/uL (ref 0.0–0.1)
Basophils Relative: 1 % (ref 0–1)
Eosinophils Absolute: 0.2 K/uL (ref 0.0–0.7)
Eosinophils Relative: 2 % (ref 0–5)
HCT: 35.2 % — ABNORMAL LOW (ref 36.0–46.0)
Hemoglobin: 11.4 g/dL — ABNORMAL LOW (ref 12.0–15.0)
Lymphocytes Relative: 28 % (ref 12–46)
Lymphs Abs: 2.3 K/uL (ref 0.7–4.0)
MCH: 27.6 pg (ref 26.0–34.0)
MCHC: 32.4 g/dL (ref 30.0–36.0)
MCV: 85.2 fL (ref 78.0–100.0)
Monocytes Absolute: 0.6 K/uL (ref 0.1–1.0)
Monocytes Relative: 7 % (ref 3–12)
Neutro Abs: 5.2 K/uL (ref 1.7–7.7)
Neutrophils Relative %: 62 % (ref 43–77)
Platelets: 269 K/uL (ref 150–400)
RBC: 4.13 MIL/uL (ref 3.87–5.11)
RDW: 15.6 % — ABNORMAL HIGH (ref 11.5–15.5)
WBC: 8.4 K/uL (ref 4.0–10.5)

## 2014-12-08 LAB — BASIC METABOLIC PANEL WITH GFR
Anion gap: 7 (ref 5–15)
BUN: 6 mg/dL (ref 6–23)
CO2: 26 mmol/L (ref 19–32)
Calcium: 8.5 mg/dL (ref 8.4–10.5)
Chloride: 102 meq/L (ref 96–112)
Creatinine, Ser: 0.71 mg/dL (ref 0.50–1.10)
GFR calc Af Amer: >90 mL/min
GFR calc non Af Amer: >90 mL/min
Glucose, Bld: 96 mg/dL (ref 70–99)
Potassium: 3 mmol/L — ABNORMAL LOW (ref 3.5–5.1)
Sodium: 135 mmol/L (ref 135–145)

## 2014-12-08 LAB — POC URINE PREG, ED: Preg Test, Ur: NEGATIVE

## 2014-12-08 MED ORDER — SODIUM CHLORIDE 0.9 % IV SOLN: 1000.0000 mL | INTRAVENOUS | Status: DC

## 2014-12-08 MED ORDER — SODIUM CHLORIDE 0.9 % IV SOLN
1000.0000 mL | Freq: Once | INTRAVENOUS | Status: AC
  Administered 2014-12-08: 1000 mL via INTRAVENOUS

## 2014-12-08 MED ORDER — POTASSIUM CHLORIDE CRYS ER 20 MEQ PO TBCR: 20.0000 meq | EXTENDED_RELEASE_TABLET | Freq: Two times a day (BID) | ORAL | Status: DC

## 2014-12-08 MED ORDER — HYDROMORPHONE HCL 1 MG/ML IJ SOLN
1.0000 mg | Freq: Once | INTRAMUSCULAR | Status: AC
  Administered 2014-12-08: 1 mg via INTRAVENOUS
  Filled 2014-12-08: qty 1

## 2014-12-08 MED ORDER — POTASSIUM CHLORIDE CRYS ER 20 MEQ PO TBCR
40.0000 meq | EXTENDED_RELEASE_TABLET | Freq: Once | ORAL | Status: AC
  Administered 2014-12-08: 40 meq via ORAL
  Filled 2014-12-08: qty 2

## 2014-12-08 MED ORDER — IOHEXOL 300 MG/ML  SOLN
75.0000 mL | Freq: Once | INTRAMUSCULAR | Status: AC | PRN
  Administered 2014-12-08: 75 mL via INTRAVENOUS

## 2014-12-08 MED ORDER — OXYCODONE-ACETAMINOPHEN 5-325 MG PO TABS: 1.0000 | ORAL_TABLET | ORAL | Status: DC | PRN

## 2014-12-08 MED ORDER — ONDANSETRON HCL 4 MG/2ML IJ SOLN
4.0000 mg | Freq: Once | INTRAMUSCULAR | Status: AC
  Administered 2014-12-08: 4 mg via INTRAVENOUS
  Filled 2014-12-08: qty 2

## 2014-12-08 MED ORDER — CLINDAMYCIN PHOSPHATE 600 MG/50ML IV SOLN
600.0000 mg | Freq: Once | INTRAVENOUS | Status: AC
  Administered 2014-12-08: 600 mg via INTRAVENOUS
  Filled 2014-12-08: qty 50

## 2014-12-08 MED ORDER — CLINDAMYCIN HCL 150 MG PO CAPS: 300.0000 mg | ORAL_CAPSULE | Freq: Four times a day (QID) | ORAL | Status: DC

## 2014-12-08 NOTE — Discharge Instructions (Signed)
Cellulitis Cellulitis is an infection of the skin and the tissue beneath it. The infected area is usually red and tender. Cellulitis occurs most often in the arms and lower legs.  CAUSES  Cellulitis is caused by bacteria that enter the skin through cracks or cuts in the skin. The most common types of bacteria that cause cellulitis are staphylococci and streptococci. SIGNS AND SYMPTOMS   Redness and warmth.  Swelling.  Tenderness or pain.  Fever. DIAGNOSIS  Your health care provider can usually determine what is wrong based on a physical exam. Blood tests may also be done. TREATMENT  Treatment usually involves taking an antibiotic medicine. HOME CARE INSTRUCTIONS   Take your antibiotic medicine as directed by your health care provider. Finish the antibiotic even if you start to feel better.  Keep the infected arm or leg elevated to reduce swelling.  Apply a warm cloth to the affected area up to 4 times per day to relieve pain.  Take medicines only as directed by your health care provider.  Keep all follow-up visits as directed by your health care provider. SEEK MEDICAL CARE IF:   You notice red streaks coming from the infected area.  Your red area gets larger or turns dark in color.  Your bone or joint underneath the infected area becomes painful after the skin has healed.  Your infection returns in the same area or another area.  You notice a swollen bump in the infected area.  You develop new symptoms.  You have a fever. SEEK IMMEDIATE MEDICAL CARE IF:   You feel very sleepy.  You develop vomiting or diarrhea.  You have a general ill feeling (malaise) with muscle aches and pains. MAKE SURE YOU:   Understand these instructions.  Will watch your condition.  Will get help right away if you are not doing well or get worse. Document Released: 09/11/2005 Document Revised: 04/18/2014 Document Reviewed: 02/17/2012 [facility] Patient Information 2015 [place], [place].  This information is not intended to replace advice given to you by your health care provider. Make sure you discuss any questions you have with your health care provider.  Clindamycin capsules What is this medicine? CLINDAMYCIN ([place] sin) is a lincosamide antibiotic. It is used to treat certain kinds of bacterial infections. It will not work for colds, flu, or other viral infections. This medicine may be used for other purposes; ask your health care provider or pharmacist if you have questions. COMMON BRAND NAME(S): Cleocin What should I tell my health care provider before I take this medicine? They need to know if you have any of these conditions: -kidney disease -liver disease -stomach problems like colitis -an unusual or allergic reaction to clindamycin, lincomycin, or other medicines, foods, dyes like tartrazine or preservatives -pregnant or trying to get pregnant -breast-feeding How should I use this medicine? Take this medicine by mouth with a full glass of water. Follow the directions on the prescription label. You can take this medicine with food or on an empty stomach. If the medicine upsets your stomach, take it with food. Take your medicine at regular intervals. Do not take your medicine more often than directed. Take all of your medicine as directed even if you think your are better. Do not skip doses or stop your medicine early. Talk to your pediatrician regarding the use of this medicine in children. Special care may be needed. Overdosage: If you think you have taken too much of this medicine contact a poison control center or  emergency room at once. NOTE: This medicine is only for you. Do not share this medicine with others. What if I miss a dose? If you miss a dose, take it as soon as you can. If it is almost time for your next dose, take only that dose. Do not take double or extra doses. What may interact with this medicine? -birth control  pills -chloramphenicol -erythromycin -kaolin products This list may not describe all possible interactions. Give your health care provider a list of all the medicines, herbs, non-prescription drugs, or dietary supplements you use. Also tell them if you smoke, drink alcohol, or use illegal drugs. Some items may interact with your medicine. What should I watch for while using this medicine? Tell your doctor or healthcare professional if your symptoms do not start to get better or if they get worse. Do not treat diarrhea with over the counter products. Contact your doctor if you have diarrhea that lasts more than 2 days or if it is severe and watery. What side effects may I notice from receiving this medicine? Side effects that you should report to your doctor or health care professional as soon as possible: -allergic reactions like skin rash, itching or hives, swelling of the face, lips, or tongue -dark urine -pain on swallowing -redness, blistering, peeling or loosening of the skin, including inside the mouth -unusual bleeding or bruising -unusually weak or tired -yellowing of eyes or skin Side effects that usually do not require medical attention (report to your doctor or health care professional if they continue or are bothersome): -diarrhea -itching in the rectal or genital area -joint pain -nausea, vomiting -stomach pain This list may not describe all possible side effects. Call your doctor for medical advice about side effects. You may report side effects to FDA at 1-800-FDA-1088. Where should I keep my medicine? Keep out of the reach of children. Store at room temperature between 20 and 25 degrees C (68 and 77 degrees F). Throw away any unused medicine after the expiration date. NOTE: This sheet is a summary. It may not cover all possible information. If you have questions about this medicine, talk to your doctor, pharmacist, or health care provider.  2015, Elsevier/Gold Standard.  (2013-07-08 16:12:32)  Acetaminophen; Oxycodone tablets What is this medicine? ACETAMINOPHEN; OXYCODONE (a set a MEE noe fen; ox i KOE done) is a pain reliever. It is used to treat mild to moderate pain. This medicine may be used for other purposes; ask your health care provider or pharmacist if you have questions. COMMON BRAND NAME(S): Endocet, Magnacet, Narvox, Percocet, Perloxx, Primalev, Primlev, Roxicet, Xolox What should I tell my health care provider before I take this medicine? They need to know if you have any of these conditions: -brain tumor -Crohn's disease, inflammatory bowel disease, or ulcerative colitis -drug abuse or addiction -head injury -heart or circulation problems -if you often drink alcohol -kidney disease or problems going to the bathroom -liver disease -lung disease, asthma, or breathing problems -an unusual or allergic reaction to acetaminophen, oxycodone, other opioid analgesics, other medicines, foods, dyes, or preservatives -pregnant or trying to get pregnant -breast-feeding How should I use this medicine? Take this medicine by mouth with a full glass of water. Follow the directions on the prescription label. Take your medicine at regular intervals. Do not take your medicine more often than directed. Talk to your pediatrician regarding the use of this medicine in children. Special care may be needed. Patients over 57 years old may have a  stronger reaction and need a smaller dose. Overdosage: If you think you have taken too much of this medicine contact a poison control center or emergency room at once. NOTE: This medicine is only for you. Do not share this medicine with others. What if I miss a dose? If you miss a dose, take it as soon as you can. If it is almost time for your next dose, take only that dose. Do not take double or extra doses. What may interact with this medicine? -alcohol -antihistamines -barbiturates like amobarbital, butalbital,  butabarbital, methohexital, pentobarbital, phenobarbital, thiopental, and secobarbital -benztropine -drugs for bladder problems like solifenacin, trospium, oxybutynin, tolterodine, hyoscyamine, and methscopolamine -drugs for breathing problems like ipratropium and tiotropium -drugs for certain stomach or intestine problems like propantheline, homatropine methylbromide, glycopyrrolate, atropine, belladonna, and dicyclomine -general anesthetics like etomidate, ketamine, nitrous oxide, propofol, desflurane, enflurane, halothane, isoflurane, and sevoflurane -medicines for depression, anxiety, or psychotic disturbances -medicines for sleep -muscle relaxants -naltrexone -narcotic medicines (opiates) for pain -phenothiazines like perphenazine, thioridazine, chlorpromazine, mesoridazine, fluphenazine, prochlorperazine, promazine, and trifluoperazine -scopolamine -tramadol -trihexyphenidyl This list may not describe all possible interactions. Give your health care provider a list of all the medicines, herbs, non-prescription drugs, or dietary supplements you use. Also tell them if you smoke, drink alcohol, or use illegal drugs. Some items may interact with your medicine. What should I watch for while using this medicine? Tell your doctor or health care professional if your pain does not go away, if it gets worse, or if you have new or a different type of pain. You may develop tolerance to the medicine. Tolerance means that you will need a higher dose of the medication for pain relief. Tolerance is normal and is expected if you take this medicine for a long time. Do not suddenly stop taking your medicine because you may develop a severe reaction. Your body becomes used to the medicine. This does NOT mean you are addicted. Addiction is a behavior related to getting and using a drug for a non-medical reason. If you have pain, you have a medical reason to take pain medicine. Your doctor will tell you how much  medicine to take. If your doctor wants you to stop the medicine, the dose will be slowly lowered over time to avoid any side effects. You may get drowsy or dizzy. Do not drive, use machinery, or do anything that needs mental alertness until you know how this medicine affects you. Do not stand or sit up quickly, especially if you are an older patient. This reduces the risk of dizzy or fainting spells. Alcohol may interfere with the effect of this medicine. Avoid alcoholic drinks. There are different types of narcotic medicines (opiates) for pain. If you take more than one type at the same time, you may have more side effects. Give your health care provider a list of all medicines you use. Your doctor will tell you how much medicine to take. Do not take more medicine than directed. Call emergency for help if you have problems breathing. The medicine will cause constipation. Try to have a bowel movement at least every 2 to 3 days. If you do not have a bowel movement for 3 days, call your doctor or health care professional. Do not take Tylenol (acetaminophen) or medicines that have acetaminophen with this medicine. Too much acetaminophen can be very dangerous. Many nonprescription medicines contain acetaminophen. Always read the labels carefully to avoid taking more acetaminophen. What side effects may I notice from receiving this  medicine? Side effects that you should report to your doctor or health care professional as soon as possible: -allergic reactions like skin rash, itching or hives, swelling of the face, lips, or tongue -breathing difficulties, wheezing -confusion -light headedness or fainting spells -severe stomach pain -unusually weak or tired -yellowing of the skin or the whites of the eyes Side effects that usually do not require medical attention (report to your doctor or health care professional if they continue or are bothersome): -dizziness -drowsiness -nausea -vomiting This list may  not describe all possible side effects. Call your doctor for medical advice about side effects. You may report side effects to FDA at 1-800-FDA-1088. Where should I keep my medicine? Keep out of the reach of children. This medicine can be abused. Keep your medicine in a safe place to protect it from theft. Do not share this medicine with anyone. Selling or giving away this medicine is dangerous and against the law. Store at room temperature between 20 and 25 degrees C (68 and 77 degrees F). Keep container tightly closed. Protect from light. This medicine may cause accidental overdose and death if it is taken by other adults, children, or pets. Flush any unused medicine down the toilet to reduce the chance of harm. Do not use the medicine after the expiration date. NOTE: This sheet is a summary. It may not cover all possible information. If you have questions about this medicine, talk to your doctor, pharmacist, or health care provider.  2015, Elsevier/Gold Standard. (2013-07-26 13:17:35)

## 2014-12-08 NOTE — ED Provider Notes (Signed)
CSN: [id number]     Arrival date & time 12/08/14  0001 History   First MD Initiated Contact with Patient 12/08/14 0308     Chief complaint: Facial pain and swelling  (Consider location/radiation/quality/duration/timing/severity/associated sxs/prior Treatment) The history is provided by the patient.   28 year old female developed swelling in the right malar area along with severe pain. She thinks it is a sinus infection. She rates the pain at 8/10. Nothing makes it better nothing makes it worse. Pain is been getting progressively worse since onset. She had nasal congestion for the last 4 days and thought that it was a sinus infection. Nasal drainage has been clear. She's taken ibuprofen for pain without relief.  History reviewed. No pertinent past medical history. Past Surgical History  Procedure Laterality Date  . Nephrectomy living donor    . Tonsillectomy Right 06/02/2014    Procedure: Irrigation and Drainage Right Floor of Mouth Dental Abscess;  Surgeon: Christia Readingwight Bates, MD;  [facility];  Service: ENT;  Laterality: Right;   Family History  Problem Relation Age of Onset  . Cancer Other    History  Substance Use Topics  . Smoking status: Current Every Day Smoker -- 0.50 packs/day for 3 years    Types: Cigarettes  . Smokeless tobacco: Never Used  . Alcohol Use: Yes     Comment: occ.   OB History    Gravida Para Term Preterm AB TAB SAB Ectopic Multiple Living   2 2 2       2      Review of Systems  All other systems reviewed and are negative.     Allergies  Ibuprofen  Home Medications   Prior to Admission medications   Medication Sig Start Date End Date Taking? Authorizing Provider  acetaminophen (TYLENOL) 500 MG tablet Take 1,000 mg by mouth 4 (four) times daily as needed for mild pain. For pain    Historical Provider, MD  chlorhexidine (PERIDEX) 0.12 % solution Use as directed 5 mLs in the mouth or throat 4 (four) times daily. 06/03/14   Flo ShanksKarol Wolicki, MD  metaxalone  (SKELAXIN) 800 MG tablet Take 1 tablet (800 mg total) by mouth 3 (three) times daily. 06/23/14   Toy BakerAnthony T Allen, MD  ondansetron (ZOFRAN ODT) 8 MG disintegrating tablet Take 1 tablet (8 mg total) by mouth every 8 (eight) hours as needed for nausea or vomiting. 06/23/14   Toy BakerAnthony T Allen, MD  oxyCODONE-acetaminophen (PERCOCET/ROXICET) 5-325 MG per tablet Take 2 tablets by mouth every 4 (four) hours as needed for severe pain. 06/23/14   Toy BakerAnthony T Allen, MD  oxyCODONE-acetaminophen (PERCOCET/ROXICET) 5-325 MG per tablet Take 1 tablet by mouth every 6 (six) hours as needed. 09/02/14   Benny LennertJoseph L Zammit, MD  oxyCODONE-acetaminophen (ROXICET) 5-325 MG per tablet Take 1-2 tablets by mouth every 4 (four) hours as needed for severe pain. 06/03/14   Flo ShanksKarol Wolicki, MD   BP 132/91 mmHg  Pulse 93  Temp(Src) 98.3 F (36.8 C) (Oral)  Resp 18  Ht 5\' 3"  (1.6 m)  Wt 130 lb (58.968 kg)  BMI 23.03 kg/m2  SpO2 99%  LMP 12/01/2014 Physical Exam  Nursing note and vitals reviewed.  28 year old female, who is crying in pain, but is in no acute distress. Vital signs are significant for borderline hypertension. Oxygen saturation is 99%, which is normal. Head is normocephalic and atraumatic. PERRLA, EOMI. Oropharynx is clear. There is slight swelling of the right malar area with tenderness to palpation in the same  area. There is no erythema and no fluctuance or crepitus. Nasal cavity is filled with mucus related to her crying but I could not see any obvious purulent drainage or edema of her turbinates. Neck is nontender and supple without adenopathy or JVD. Back is nontender and there is no CVA tenderness. Lungs are clear without rales, wheezes, or rhonchi. Chest is nontender. Heart has regular rate and rhythm without murmur. Abdomen is soft, flat, nontender without masses or hepatosplenomegaly and peristalsis is normoactive. Extremities have no cyanosis or edema, full range of motion is present. Skin is warm and dry without  rash. Neurologic: Mental status is normal, cranial nerves are intact, there are no motor or sensory deficits.  ED Course  Procedures (including critical care time) Labs Review Results for orders placed or performed during the hospital encounter of 12/08/14  Basic metabolic panel  Result Value Ref Range   Sodium 135 135 - 145 mmol/L   Potassium 3.0 (L) 3.5 - 5.1 mmol/L   Chloride 102 96 - 112 mEq/L   CO2 26 19 - 32 mmol/L   Glucose, Bld 96 70 - 99 mg/dL   BUN 6 6 - 23 mg/dL   Creatinine, Ser 9.600.71 0.50 - 1.10 mg/dL   Calcium 8.5 8.4 - 45.410.5 mg/dL   GFR calc non Af Amer >90 >90 mL/min   GFR calc Af Amer >90 >90 mL/min   Anion gap 7 5 - 15  CBC with Differential  Result Value Ref Range   WBC 8.4 4.0 - 10.5 K/uL   RBC 4.13 3.87 - 5.11 MIL/uL   Hemoglobin 11.4 (L) 12.0 - 15.0 g/dL   HCT 09.835.2 (L) 11.936.0 - 14.746.0 %   MCV 85.2 78.0 - 100.0 fL   MCH 27.6 26.0 - 34.0 pg   MCHC 32.4 30.0 - 36.0 g/dL   RDW 82.915.6 (H) 56.211.5 - 13.015.5 %   Platelets 269 150 - 400 K/uL   Neutrophils Relative % 62 43 - 77 %   Neutro Abs 5.2 1.7 - 7.7 K/uL   Lymphocytes Relative 28 12 - 46 %   Lymphs Abs 2.3 0.7 - 4.0 K/uL   Monocytes Relative 7 3 - 12 %   Monocytes Absolute 0.6 0.1 - 1.0 K/uL   Eosinophils Relative 2 0 - 5 %   Eosinophils Absolute 0.2 0.0 - 0.7 K/uL   Basophils Relative 1 0 - 1 %   Basophils Absolute 0.1 0.0 - 0.1 K/uL  POC urine preg, [facility] ([facility])  Result Value Ref Range   Preg Test, Ur NEGATIVE NEGATIVE    Imaging Review Ct Maxillofacial W/cm  12/08/2014   CLINICAL DATA:  Initial evaluation for facial pain and swelling. Right eye pain.  EXAM: CT MAXILLOFACIAL WITH CONTRAST  TECHNIQUE: Multidetector CT imaging of the maxillofacial structures was performed with intravenous contrast. Multiplanar CT image reconstructions were also generated. A small metallic BB was placed on the right temple in order to reliably differentiate right from left.  CONTRAST:  75mL OMNIPAQUE IOHEXOL 300 MG/ML  SOLN   COMPARISON:  Prior study from 06/23/2014.  FINDINGS: Visualized portions of the intracranial contents are unremarkable.  The globes are symmetric in size and appearance without acute abnormality. The intraconal and extraconal fat are well preserved. No retro-orbital inflammatory stranding or other process. Extraocular muscles and optic nerves are normal.  There is asymmetric soft tissue swelling with inflammatory fat stranding within the right infraorbital soft tissues of the right face (series 2, image 26). Finding is  indeterminate, but could reflect cellulitis in the correct clinical setting. No postseptal extension into the bony left orbit. There is mild asymmetric swelling at right medial canthus extending towards the nasal bridge. Inferiorly, there is mild asymmetric swelling with inflammatory stranding within the right cheek. There is slightly more focal phlegmonous change along the buccal mucosa at the right maxilla (series 4, image 27), suspicious for possible early developing subperiosteal abscess no definite drainable fluid collections at this time.  Anterior left ethmoidal air cells are largely opacified. There is mild polypoid mucosal thickening within the maxillary sinuses bilaterally. No air-fluid levels to suggest active sinusitis. The visualized mastoid air cells are clear.  No acute maxillofacial fracture.  Scattered dental caries noted.  IMPRESSION: 1. Soft tissue swelling with inflammatory stranding within the right periorbital and facial soft tissues as above, suspicious for cellulitis. No postseptal extension into the bony right orbit. No intraorbital involvement within the right globe. More confluent phlegmonous change along the buccal mucosa adjacent to the right maxilla suspicious for possible developing odontogenic/subperiosteal abscess. 2. Left ethmoidal and maxillary sinus disease as above. No air-fluid levels to suggest active sinusitis. 3. Scattered dental caries.   Electronically Signed    By: Rise Mu M.D.   On: 12/08/2014 05:07     MDM   Final diagnoses:  Facial pain  Facial swelling  Facial cellulitis  Hypokalemia    Right malar pain and swelling which may be related to sinusitis. She will be sent for CT scan. The meantime, she is given IV fluids and IV hydromorphone for pain.  Laboratory workup is unremarkable including normal WBC with normal differential. CT shows evidence of facial cellulitis without any 2 component. Also dental caries are noted. Patient is advised that she would benefit from appropriate dental care. She is given dose of clindamycin and is discharged with prescriptions for clindamycin and oxycodone acetaminophen. Hyperkalemia started and she is given at a dose of oral and intravenous potassium and is also given a prescription for K-Dur. Patient does state that she recently had a stomach virus and that be the reason that she has hypokalemia currently.  Dione Booze, MD 12/08/14 [phone]

## 2014-12-08 NOTE — ED Notes (Signed)
Pt reporting pain and swelling in sinuses.  States she believes she has a sinus infection.  Symptoms worsening during day.

## 2015-02-12 ENCOUNTER — Encounter (HOSPITAL_COMMUNITY): Payer: Self-pay

## 2015-02-12 ENCOUNTER — Emergency Department (HOSPITAL_COMMUNITY)
Admission: EM | Admit: 2015-02-12 | Discharge: 2015-02-12 | Disposition: A | Discharge status: Home / Self Care | Payer: Self-pay | Attending: Emergency Medicine | Admitting: Emergency Medicine

## 2015-02-12 DIAGNOSIS — D649 Anemia, unspecified: Secondary | ICD-10-CM | POA: Insufficient documentation

## 2015-02-12 DIAGNOSIS — K529 Noninfective gastroenteritis and colitis, unspecified: Secondary | ICD-10-CM | POA: Insufficient documentation

## 2015-02-12 DIAGNOSIS — Z720 Tobacco use: Secondary | ICD-10-CM | POA: Insufficient documentation

## 2015-02-12 DIAGNOSIS — Z3202 Encounter for pregnancy test, result negative: Secondary | ICD-10-CM | POA: Insufficient documentation

## 2015-02-12 DIAGNOSIS — Z792 Long term (current) use of antibiotics: Secondary | ICD-10-CM | POA: Insufficient documentation

## 2015-02-12 DIAGNOSIS — Z79899 Other long term (current) drug therapy: Secondary | ICD-10-CM | POA: Insufficient documentation

## 2015-02-12 LAB — CBC WITH DIFFERENTIAL/PLATELET
BASOS ABS: 0 10*3/uL (ref 0.0–0.1)
Basophils Relative: 0 % (ref 0–1)
EOS ABS: 0.3 10*3/uL (ref 0.0–0.7)
Eosinophils Relative: 5 % (ref 0–5)
HEMATOCRIT: 34.5 % — AB (ref 36.0–46.0)
Hemoglobin: 11 g/dL — ABNORMAL LOW (ref 12.0–15.0)
Lymphocytes Relative: 48 % — ABNORMAL HIGH (ref 12–46)
Lymphs Abs: 2.5 10*3/uL (ref 0.7–4.0)
MCH: 27.8 pg (ref 26.0–34.0)
MCHC: 31.9 g/dL (ref 30.0–36.0)
MCV: 87.1 fL (ref 78.0–100.0)
Monocytes Absolute: 0.4 10*3/uL (ref 0.1–1.0)
Monocytes Relative: 8 % (ref 3–12)
NEUTROS ABS: 2 10*3/uL (ref 1.7–7.7)
NEUTROS PCT: 39 % — AB (ref 43–77)
Platelets: 275 10*3/uL (ref 150–400)
RBC: 3.96 MIL/uL (ref 3.87–5.11)
RDW: 15.9 % — AB (ref 11.5–15.5)
WBC: 5.1 10*3/uL (ref 4.0–10.5)

## 2015-02-12 LAB — URINALYSIS, ROUTINE W REFLEX MICROSCOPIC
Bilirubin Urine: NEGATIVE
Glucose, UA: NEGATIVE mg/dL
Hgb urine dipstick: NEGATIVE
Ketones, ur: NEGATIVE mg/dL
LEUKOCYTES UA: NEGATIVE
NITRITE: NEGATIVE
Protein, ur: NEGATIVE mg/dL
Specific Gravity, Urine: 1.01 (ref 1.005–1.030)
Urobilinogen, UA: 0.2 mg/dL (ref 0.0–1.0)
pH: 6 (ref 5.0–8.0)

## 2015-02-12 LAB — BASIC METABOLIC PANEL
Anion gap: 2 — ABNORMAL LOW (ref 5–15)
BUN: 7 mg/dL (ref 6–23)
CALCIUM: 8.1 mg/dL — AB (ref 8.4–10.5)
CHLORIDE: 106 mmol/L (ref 96–112)
CO2: 29 mmol/L (ref 19–32)
CREATININE: 0.77 mg/dL (ref 0.50–1.10)
GFR calc Af Amer: >90 mL/min (ref >90)
GFR calc non Af Amer: >90 mL/min (ref >90)
GLUCOSE: 84 mg/dL (ref 70–99)
Potassium: 4 mmol/L (ref 3.5–5.1)
SODIUM: 137 mmol/L (ref 135–145)

## 2015-02-12 LAB — PREGNANCY, URINE: PREG TEST UR: NEGATIVE

## 2015-02-12 MED ORDER — SODIUM CHLORIDE 0.9 % IV SOLN
1000.0000 mL | INTRAVENOUS | Status: DC
  Administered 2015-02-12: 1000 mL via INTRAVENOUS

## 2015-02-12 MED ORDER — SODIUM CHLORIDE 0.9 % IV SOLN
1000.0000 mL | Freq: Once | INTRAVENOUS | Status: AC
  Administered 2015-02-12: 1000 mL via INTRAVENOUS

## 2015-02-12 MED ORDER — LOPERAMIDE HCL 2 MG PO CAPS
4.0000 mg | ORAL_CAPSULE | Freq: Once | ORAL | Status: AC
  Administered 2015-02-12: 4 mg via ORAL
  Filled 2015-02-12: qty 2

## 2015-02-12 MED ORDER — ONDANSETRON HCL 4 MG PO TABS: 4.0000 mg | ORAL_TABLET | Freq: Four times a day (QID) | ORAL | Status: DC | PRN

## 2015-02-12 MED ORDER — ONDANSETRON HCL 4 MG/2ML IJ SOLN
4.0000 mg | Freq: Once | INTRAMUSCULAR | Status: AC
  Administered 2015-02-12: 4 mg via INTRAVENOUS
  Filled 2015-02-12: qty 2

## 2015-02-12 NOTE — ED Notes (Signed)
Nausea/vomiting/diarrhea for 2 days

## 2015-02-12 NOTE — Discharge Instructions (Signed)
Take loperamide (Imodium AD) as needed for diarrhea. ° °Nausea and Vomiting °Nausea is a sick feeling that often comes before throwing up (vomiting). Vomiting is a reflex where stomach contents come out of your mouth. Vomiting can cause severe loss of body fluids (dehydration). Children and elderly adults can become dehydrated quickly, especially if they also have diarrhea. Nausea and vomiting are symptoms of a condition or disease. It is important to find the cause of your symptoms. °CAUSES  °· Direct irritation of the stomach lining. This irritation can result from increased acid production (gastroesophageal reflux disease), infection, food poisoning, taking certain medicines (such as nonsteroidal anti-inflammatory drugs), alcohol use, or tobacco use. °· Signals from the brain. These signals could be caused by a headache, heat exposure, an inner ear disturbance, increased pressure in the brain from injury, infection, a tumor, or a concussion, pain, emotional stimulus, or metabolic problems. °· An obstruction in the gastrointestinal tract (bowel obstruction). °· Illnesses such as diabetes, hepatitis, gallbladder problems, appendicitis, kidney problems, cancer, sepsis, atypical symptoms of a heart attack, or eating disorders. °· Medical treatments such as chemotherapy and radiation. °· Receiving medicine that makes you sleep (general anesthetic) during surgery. °DIAGNOSIS °Your caregiver may ask for tests to be done if the problems do not improve after a few days. Tests may also be done if symptoms are severe or if the reason for the nausea and vomiting is not clear. Tests may include: °· Urine tests. °· Blood tests. °· Stool tests. °· Cultures (to look for evidence of infection). °· X-rays or other imaging studies. °Test results can help your caregiver make decisions about treatment or the need for additional tests. °TREATMENT °You need to stay well hydrated. Drink frequently but in small amounts. You may wish to  drink water, sports drinks, clear broth, or eat frozen ice pops or gelatin dessert to help stay hydrated. When you eat, eating slowly may help prevent nausea. There are also some antinausea medicines that may help prevent nausea. °HOME CARE INSTRUCTIONS  °· Take all medicine as directed by your caregiver. °· If you do not have an appetite, do not force yourself to eat. However, you must continue to drink fluids. °· If you have an appetite, eat a normal diet unless your caregiver tells you differently. °· Eat a variety of complex carbohydrates (rice, wheat, potatoes, bread), lean meats, yogurt, fruits, and vegetables. °· Avoid high-fat foods because they are more difficult to digest. °· Drink enough water and fluids to keep your urine clear or pale yellow. °· If you are dehydrated, ask your caregiver for specific rehydration instructions. Signs of dehydration may include: °· Severe thirst. °· Dry lips and mouth. °· Dizziness. °· Dark urine. °· Decreasing urine frequency and amount. °· Confusion. °· Rapid breathing or pulse. °SEEK IMMEDIATE MEDICAL CARE IF:  °· You have blood or brown flecks (like coffee grounds) in your vomit. °· You have black or bloody stools. °· You have a severe headache or stiff neck. °· You are confused. °· You have severe abdominal pain. °· You have chest pain or trouble breathing. °· You do not urinate at least once every 8 hours. °· You develop cold or clammy skin. °· You continue to vomit for longer than 24 to 48 hours. °· You have a fever. °MAKE SURE YOU:  °· Understand these instructions. °· Will watch your condition. °· Will get help right away if you are not doing well or get worse. °Document Released: 12/02/2005 Document Revised: 02/24/2012 Document Reviewed: 05/01/2011 °ExitCare® Patient   Information ©2015 [employer]. This information is not intended to replace advice given to you by your health care provider. Make sure you discuss any questions you have with your health care  provider. ° °Diarrhea °Diarrhea is frequent loose and watery bowel movements. It can cause you to feel weak and dehydrated. Dehydration can cause you to become tired and thirsty, have a dry mouth, and have decreased urination that often is dark yellow. Diarrhea is a sign of another problem, most often an infection that will not last long. In most cases, diarrhea typically lasts 2-3 days. However, it can last longer if it is a sign of something more serious. It is important to treat your diarrhea as directed by your caregiver to lessen or prevent future episodes of diarrhea. °CAUSES  °Some common causes include: °· Gastrointestinal infections caused by viruses, bacteria, or parasites. °· Food poisoning or food allergies. °· Certain medicines, such as antibiotics, chemotherapy, and laxatives. °· Artificial sweeteners and fructose. °· Digestive disorders. °HOME CARE INSTRUCTIONS °· Ensure adequate fluid intake (hydration): Have 1 cup (8 oz) of fluid for each diarrhea episode. Avoid fluids that contain simple sugars or sports drinks, fruit juices, whole milk products, and sodas. Your urine should be clear or pale yellow if you are drinking enough fluids. Hydrate with an oral rehydration solution that you can purchase at pharmacies, retail stores, and online. You can prepare an oral rehydration solution at home by mixing the following ingredients together: °¨  - tsp table salt. °¨ ¾ tsp baking soda. °¨  tsp salt substitute containing potassium chloride. °¨ 1  tablespoons sugar. °¨ 1 L (34 oz) of water. °· Certain foods and beverages may increase the speed at which food moves through the gastrointestinal (GI) tract. These foods and beverages should be avoided and include: °¨ Caffeinated and alcoholic beverages. °¨ High-fiber foods, such as raw fruits and vegetables, nuts, seeds, and whole grain breads and cereals. °¨ Foods and beverages sweetened with sugar alcohols, such as xylitol, sorbitol, and mannitol. °· Some foods  may be well tolerated and may help thicken stool including: °¨ Starchy foods, such as rice, toast, pasta, low-sugar cereal, oatmeal, grits, baked potatoes, crackers, and bagels. °¨ Bananas. °¨ Applesauce. °· Add probiotic-rich foods to help increase healthy bacteria in the GI tract, such as yogurt and fermented milk products. °· Wash your hands well after each diarrhea episode. °· Only take over-the-counter or prescription medicines as directed by your caregiver. °· Take a warm bath to relieve any burning or pain from frequent diarrhea episodes. °SEEK IMMEDIATE MEDICAL CARE IF:  °· You are unable to keep fluids down. °· You have persistent vomiting. °· You have blood in your stool, or your stools are black and tarry. °· You do not urinate in 6-8 hours, or there is only a small amount of very dark urine. °· You have abdominal pain that increases or localizes. °· You have weakness, dizziness, confusion, or light-headedness. °· You have a severe headache. °· Your diarrhea gets worse or does not get better. °· You have a fever or persistent symptoms for more than 2-3 days. °· You have a fever and your symptoms suddenly get worse. °MAKE SURE YOU:  °· Understand these instructions. °· Will watch your condition. °· Will get help right away if you are not doing well or get worse. °Document Released: 11/22/2002 Document Revised: 04/18/2014 Document Reviewed: 08/09/2012 °ExitCare® Patient Information ©2015 [employer]. This information is not intended to replace advice given to you   by your health care provider. Make sure you discuss any questions you have with your health care provider. ° °Ondansetron tablets °What is this medicine? °ONDANSETRON (on DAN se tron) is used to treat nausea and vomiting caused by chemotherapy. It is also used to prevent or treat nausea and vomiting after surgery. °This medicine may be used for other purposes; ask your health care provider or pharmacist if you have questions. °COMMON BRAND  NAME(S): Zofran °What should I tell my health care provider before I take this medicine? °They need to know if you have any of these conditions: °-heart disease °-history of irregular heartbeat °-liver disease °-low levels of magnesium or potassium in the blood °-an unusual or allergic reaction to ondansetron, granisetron, other medicines, foods, dyes, or preservatives °-pregnant or trying to get pregnant °-breast-feeding °How should I use this medicine? °Take this medicine by mouth with a glass of water. Follow the directions on your prescription label. Take your doses at regular intervals. Do not take your medicine more often than directed. °Talk to your pediatrician regarding the use of this medicine in children. Special care may be needed. °Overdosage: If you think you have taken too much of this medicine contact a poison control center or emergency room at once. °NOTE: This medicine is only for you. Do not share this medicine with others. °What if I miss a dose? °If you miss a dose, take it as soon as you can. If it is almost time for your next dose, take only that dose. Do not take double or extra doses. °What may interact with this medicine? °Do not take this medicine with any of the following medications: °-apomorphine °-certain medicines for fungal infections like fluconazole, itraconazole, ketoconazole, posaconazole, voriconazole °-cisapride °-dofetilide °-dronedarone °-pimozide °-thioridazine °-ziprasidone °This medicine may also interact with the following medications: °-carbamazepine °-certain medicines for depression, anxiety, or psychotic disturbances °-fentanyl °-linezolid °-MAOIs like Carbex, Eldepryl, Marplan, Nardil, and Parnate °-methylene blue (injected into a vein) °-other medicines that prolong the QT interval (cause an abnormal heart rhythm) °-phenytoin °-rifampicin °-tramadol °This list may not describe all possible interactions. Give your health care provider a list of all the medicines,  herbs, non-prescription drugs, or dietary supplements you use. Also tell them if you smoke, drink alcohol, or use illegal drugs. Some items may interact with your medicine. °What should I watch for while using this medicine? °Check with your doctor or health care professional right away if you have any sign of an allergic reaction. °What side effects may I notice from receiving this medicine? °Side effects that you should report to your doctor or health care professional as soon as possible: °-allergic reactions like skin rash, itching or hives, swelling of the face, lips or tongue °-breathing problems °-confusion °-dizziness °-fast or irregular heartbeat °-feeling faint or lightheaded, falls °-fever and chills °-loss of balance or coordination °-seizures °-sweating °-swelling of the hands or feet °-tightness in the chest °-tremors °-unusually weak or tired °Side effects that usually do not require medical attention (report to your doctor or health care professional if they continue or are bothersome): °-constipation or diarrhea °-headache °This list may not describe all possible side effects. Call your doctor for medical advice about side effects. You may report side effects to FDA at 1-800-FDA-1088. °Where should I keep my medicine? °Keep out of the reach of children. °Store between 2 and 30 degrees C (36 and 86 degrees F). Throw away any unused medicine after the expiration date. °NOTE: This sheet is a   summary. It may not cover all possible information. If you have questions about this medicine, talk to your doctor, pharmacist, or health care provider. °© 2015, [employer]. (2013-09-08 16:27:45) ° °

## 2015-02-12 NOTE — ED Provider Notes (Signed)
CSN: [id number]     Arrival date & time 02/12/15  0207 History   First MD Initiated Contact with Patient 02/12/15 [phone]     Chief Complaint  Patient presents with  . Emesis     (Consider location/radiation/quality/duration/timing/severity/associated sxs/prior Treatment) Patient is a 29 y.o. female presenting with vomiting. The history is provided by the patient.  Emesis She started getting sick 2 days ago with diarrhea. Today, she has also had nausea with a couple of episodes of emesis. Diarrhea has slowed but there is a sense of rectal urgency and tenesmus. She denies fever, chills, sweats. There is some abdominal cramping which she rates at 5/10. She denies any blood or mucus in stool or emesis. She's not taking anything at home. She has had sick contacts in that 2 of her children have had a similar illness.  History reviewed. No pertinent past medical history. Past Surgical History  Procedure Laterality Date  . Nephrectomy living donor    . Tonsillectomy Right 06/02/2014    Procedure: Irrigation and Drainage Right Floor of Mouth Dental Abscess;  Surgeon: Christia Reading, MD;  Location: [facility] OR;  Service: ENT;  Laterality: Right;   Family History  Problem Relation Age of Onset  . Cancer Other    History  Substance Use Topics  . Smoking status: Current Every Day Smoker -- 0.50 packs/day for 3 years    Types: Cigarettes  . Smokeless tobacco: Never Used  . Alcohol Use: Yes     Comment: occ.   OB History    Gravida Para Term Preterm AB TAB SAB Ectopic Multiple Living   Review of Systems  Gastrointestinal: Positive for vomiting.  All other systems reviewed and are negative.     Allergies  Ibuprofen  Home Medications   Prior to Admission medications   Medication Sig Start Date End Date Taking? Authorizing Provider  acetaminophen (TYLENOL) 500 MG tablet Take 1,000 mg by mouth 4 (four) times daily as needed for mild pain. For pain    Historical Provider, MD   chlorhexidine (PERIDEX) 0.12 % solution Use as directed 5 mLs in the mouth or throat 4 (four) times daily. 06/03/14   Flo Shanks, MD  clindamycin (CLEOCIN) 150 MG capsule Take 2 capsules (300 mg total) by mouth every 6 (six) hours. 12/08/14   Dione Booze, MD  metaxalone (SKELAXIN) 800 MG tablet Take 1 tablet (800 mg total) by mouth 3 (three) times daily. 06/23/14   Toy Baker, MD  ondansetron (ZOFRAN ODT) 8 MG disintegrating tablet Take 1 tablet (8 mg total) by mouth every 8 (eight) hours as needed for nausea or vomiting. 06/23/14   Toy Baker, MD  oxyCODONE-acetaminophen (PERCOCET) 5-325 MG per tablet Take 1 tablet by mouth every 4 (four) hours as needed for moderate pain. 12/08/14   Dione Booze, MD  potassium chloride SA (K-DUR,KLOR-CON) 20 MEQ tablet Take 1 tablet (20 mEq total) by mouth 2 (two) times daily. 12/08/14   Dione Booze, MD   BP 117/75 mmHg  Pulse 85  Temp(Src) 97.8 F (36.6 C) (Oral)  Resp 16  Ht  (1.6 m)  Wt 145 lb (65.772 kg)  BMI 25.69 kg/m2  SpO2 98%  LMP 02/11/2015 (Exact Date) Physical Exam  Nursing note and vitals reviewed.  29 year old female, resting comfortably and in no acute distress. Vital signs are normal. Oxygen saturation is 98%, which is normal. Head is normocephalic  and atraumatic. PERRLA, EOMI. Oropharynx is clear. Neck is nontender and supple without adenopathy or JVD. Back is nontender and there is no CVA tenderness. Lungs are clear without rales, wheezes, or rhonchi. Chest is nontender. Heart has regular rate and rhythm without murmur. Abdomen is soft, flat, with mild tenderness diffusely. There is no rebound or guarding. There are no masses or hepatosplenomegaly and peristalsis is hypoactive. Extremities have no cyanosis or edema, full range of motion is present. Skin is warm and dry without rash. Neurologic: Mental status is normal, cranial nerves are intact, there are no motor or sensory deficits.  ED Course  Procedures  (including critical care time) Labs Review Results for orders placed or performed during the hospital encounter of 02/12/15  Basic metabolic panel  Result Value Ref Range   Sodium 137 135 - 145 mmol/L   Potassium 4.0 3.5 - 5.1 mmol/L   Chloride 106 96 - 112 mmol/L   CO2 29 19 - 32 mmol/L   Glucose, Bld 84 70 - 99 mg/dL   BUN 7 6 - 23 mg/dL   Creatinine, Ser 1.610.77 0.50 - 1.10 mg/dL   Calcium 8.1 (L) 8.4 - 10.5 mg/dL   GFR calc non Af Amer >90 >90 mL/min   GFR calc Af Amer >90 >90 mL/min   Anion gap 2 (L) 5 - 15  CBC with Differential  Result Value Ref Range   WBC 5.1 4.0 - 10.5 K/uL   RBC 3.96 3.87 - 5.11 MIL/uL   Hemoglobin 11.0 (L) 12.0 - 15.0 g/dL   HCT 09.634.5 (L) 04.536.0 - 40.946.0 %   MCV 87.1 78.0 - 100.0 fL   MCH 27.8 26.0 - 34.0 pg   MCHC 31.9 30.0 - 36.0 g/dL   RDW 81.115.9 (H) 91.411.5 - 78.215.5 %   Platelets 275 150 - 400 K/uL   Neutrophils Relative % 39 (L) 43 - 77 %   Neutro Abs 2.0 1.7 - 7.7 K/uL   Lymphocytes Relative 48 (H) 12 - 46 %   Lymphs Abs 2.5 0.7 - 4.0 K/uL   Monocytes Relative 8 3 - 12 %   Monocytes Absolute 0.4 0.1 - 1.0 K/uL   Eosinophils Relative 5 0 - 5 %   Eosinophils Absolute 0.3 0.0 - 0.7 K/uL   Basophils Relative 0 0 - 1 %   Basophils Absolute 0.0 0.0 - 0.1 K/uL  Urinalysis, Routine w reflex microscopic  Result Value Ref Range   Color, Urine YELLOW YELLOW   APPearance CLEAR CLEAR   Specific Gravity, Urine 1.010 1.005 - 1.030   pH 6.0 5.0 - 8.0   Glucose, UA NEGATIVE NEGATIVE mg/dL   Hgb urine dipstick NEGATIVE NEGATIVE   Bilirubin Urine NEGATIVE NEGATIVE   Ketones, ur NEGATIVE NEGATIVE mg/dL   Protein, ur NEGATIVE NEGATIVE mg/dL   Urobilinogen, UA 0.2 0.0 - 1.0 mg/dL   Nitrite NEGATIVE NEGATIVE   Leukocytes, UA NEGATIVE NEGATIVE  Pregnancy, urine  Result Value Ref Range   Preg Test, Ur NEGATIVE NEGATIVE    MDM   Final diagnoses:  Gastroenteritis  Normochromic normocytic anemia    Vomiting and diarrhea which most likely represents a viral  gastroenteritis based on history. No concerning findings on physical exam. She will be given IV hydration, ondansetron for nausea, and loperamide for diarrhea.  She feels significantly better after above noted treatment. Laboratory workup is significant only for mild anemia which is unchanged from baseline. She is discharged with instructions to take over-the-counter loperamide as needed for diarrhea  and is given a take-home pack of ondansetron.  Dione Booze, MD 02/12/15 [phone]

## 2015-02-13 MED FILL — Ondansetron HCl Tab 4 MG: ORAL | Qty: 4 | Status: AC

## 2015-04-02 ENCOUNTER — Emergency Department (HOSPITAL_COMMUNITY)
Admission: EM | Admit: 2015-04-02 | Discharge: 2015-04-02 | Disposition: A | Discharge status: Home / Self Care | Payer: Self-pay | Attending: Emergency Medicine | Admitting: Emergency Medicine

## 2015-04-02 ENCOUNTER — Encounter (HOSPITAL_COMMUNITY): Payer: Self-pay | Admitting: *Deleted

## 2015-04-02 DIAGNOSIS — K088 Other specified disorders of teeth and supporting structures: Secondary | ICD-10-CM | POA: Insufficient documentation

## 2015-04-02 DIAGNOSIS — Z79899 Other long term (current) drug therapy: Secondary | ICD-10-CM | POA: Insufficient documentation

## 2015-04-02 DIAGNOSIS — K0889 Other specified disorders of teeth and supporting structures: Secondary | ICD-10-CM

## 2015-04-02 DIAGNOSIS — Z720 Tobacco use: Secondary | ICD-10-CM | POA: Insufficient documentation

## 2015-04-02 MED ORDER — PENICILLIN V POTASSIUM 500 MG PO TABS: 500.0000 mg | ORAL_TABLET | Freq: Four times a day (QID) | ORAL | Status: AC

## 2015-04-02 MED ORDER — PENICILLIN V POTASSIUM 250 MG PO TABS
500.0000 mg | ORAL_TABLET | Freq: Once | ORAL | Status: AC
  Administered 2015-04-02: 500 mg via ORAL
  Filled 2015-04-02: qty 2

## 2015-04-02 NOTE — Discharge Instructions (Signed)

## 2015-04-02 NOTE — ED Provider Notes (Signed)
CSN: [id number]     Arrival date & time 04/02/15  0303 History   First MD Initiated Contact with Patient 04/02/15 [phone]     Chief Complaint  Patient presents with  . Dental Pain    Patient is a 29 y.o. female presenting with tooth pain. The history is provided by the patient.  Dental Pain Location:  Upper Severity:  Moderate Onset quality:  Gradual Duration:  1 day Timing:  Constant Progression:  Worsening Chronicity:  Recurrent Relieved by:  Nothing Worsened by:  Jaw movement and pressure Associated symptoms: gum swelling   Associated symptoms: no difficulty swallowing, no drooling, no facial swelling and no fever     History reviewed. No pertinent past medical history. Past Surgical History  Procedure Laterality Date  . Nephrectomy living donor    . Tonsillectomy Right 06/02/2014    Procedure: Irrigation and Drainage Right Floor of Mouth Dental Abscess;  Surgeon: Christia Readingwight Bates, MD;  [facility];  Service: ENT;  Laterality: Right;   Family History  Problem Relation Age of Onset  . Cancer Other    History  Substance Use Topics  . Smoking status: Current Every Day Smoker -- 0.50 packs/day for 3 years    Types: Cigarettes  . Smokeless tobacco: Never Used  . Alcohol Use: Yes     Comment: occ.   OB History    Gravida Para Term Preterm AB TAB SAB Ectopic Multiple Living   2 2 2       2      Review of Systems  Constitutional: Negative for fever.  HENT: Negative for drooling, facial swelling and trouble swallowing.   Eyes: Negative for visual disturbance.      Allergies  Ibuprofen  Home Medications   Prior to Admission medications   Medication Sig Start Date End Date Taking? Authorizing Provider  acetaminophen (TYLENOL) 500 MG tablet Take 1,000 mg by mouth 4 (four) times daily as needed for mild pain. For pain   Yes Historical Provider, MD  penicillin v potassium (VEETID) 500 MG tablet Take 1 tablet (500 mg total) by mouth 4 (four) times daily. 04/02/15 04/09/15   Zadie Rhineonald Sequan Auxier, MD   BP 127/78 mmHg  Pulse 92  Temp(Src) 97.9 F (36.6 C) (Oral)  Resp 18  Ht 5\' 3"  (1.6 m)  Wt 145 lb (65.772 kg)  BMI 25.69 kg/m2  SpO2 100%  LMP 04/02/2015 Physical Exam CONSTITUTIONAL: Well developed/well nourished HEAD AND FACE: Normocephalic/atraumatic EYES: EOMI/PERRL ENMT: Mucous membranes moist.  Poor dentition.  No trismus.  No focal abscess noted.  Normal phonation.  Diffuse tenderness to gingiva without fluctuance.  No stridor and no drooling NECK: supple no meningeal signs CV: S1/S2 noted, no murmurs/rubs/gallops noted LUNGS: Lungs are clear to auscultation bilaterally, no apparent distress ABDOMEN: soft, nontender, no rebound or guarding NEURO: Pt is awake/alert, moves all extremitiesx4 EXTREMITIES:full ROM SKIN: warm, color normal  ED Course  Procedures  Pt with significant poor dental hygiene and previous h/o significant dental infection requiring operative management Today she is well appearing and pain is localized to gingiva Will start PCN Advised to also use APAP Stressed importance of dental followup  MDM   Final diagnoses:  Pain, dental    Nursing notes including past medical history and social history reviewed and considered in documentation     Zadie Rhineonald Abran Gavigan, MD 04/02/15 [phone]

## 2015-04-02 NOTE — ED Notes (Signed)
Pt reports dental pain that started yesterday.

## 2015-05-04 ENCOUNTER — Emergency Department (HOSPITAL_COMMUNITY)
Admission: EM | Admit: 2015-05-04 | Discharge: 2015-05-04 | Disposition: A | Discharge status: Home / Self Care | Payer: Self-pay | Attending: Emergency Medicine | Admitting: Emergency Medicine

## 2015-05-04 ENCOUNTER — Encounter (HOSPITAL_COMMUNITY): Payer: Self-pay | Admitting: *Deleted

## 2015-05-04 DIAGNOSIS — R51 Headache: Secondary | ICD-10-CM | POA: Insufficient documentation

## 2015-05-04 DIAGNOSIS — R519 Headache, unspecified: Secondary | ICD-10-CM

## 2015-05-04 DIAGNOSIS — Z720 Tobacco use: Secondary | ICD-10-CM | POA: Insufficient documentation

## 2015-05-04 DIAGNOSIS — R600 Localized edema: Secondary | ICD-10-CM | POA: Insufficient documentation

## 2015-05-04 DIAGNOSIS — K029 Dental caries, unspecified: Secondary | ICD-10-CM | POA: Insufficient documentation

## 2015-05-04 MED ORDER — AMOXICILLIN-POT CLAVULANATE 875-125 MG PO TABS: 1.0000 | ORAL_TABLET | Freq: Two times a day (BID) | ORAL | Status: DC

## 2015-05-04 MED ORDER — HYDROCODONE-ACETAMINOPHEN 5-325 MG PO TABS: ORAL_TABLET | ORAL | Status: DC

## 2015-05-04 MED ORDER — HYDROCODONE-ACETAMINOPHEN 5-325 MG PO TABS
1.0000 | ORAL_TABLET | Freq: Once | ORAL | Status: AC
  Administered 2015-05-04: 1 via ORAL
  Filled 2015-05-04: qty 1

## 2015-05-04 MED ORDER — AMOXICILLIN-POT CLAVULANATE 875-125 MG PO TABS
1.0000 | ORAL_TABLET | Freq: Once | ORAL | Status: AC
  Administered 2015-05-04: 1 via ORAL
  Filled 2015-05-04: qty 1

## 2015-05-04 NOTE — ED Provider Notes (Signed)
CSN: [id number]     Arrival date & time 05/04/15  1228 History   First MD Initiated Contact with Patient 05/04/15 1241     No chief complaint on file.    (Consider location/radiation/quality/duration/timing/severity/associated sxs/prior Treatment) HPI   Brittany [name] is a 29 y.o. female who presents to the Emergency Department complaining of facial pain and swelling.  She states she noticed swelling along her left nose one day prior to arrival.  She reports having multiple "bad teeth" and thinks her symptoms may be related to a dental abscess.  She has tried tylenol without relief.  She denies neck pain, difficulty swallowing or breathing, neck pain or fever.   History reviewed. No pertinent past medical history. Past Surgical History  Procedure Laterality Date  . Tonsillectomy Right 06/02/2014    Procedure: Irrigation and Drainage Right Floor of Mouth Dental Abscess;  Surgeon: Christia Readingwight Bates, MD;  [facility];  Service: ENT;  Laterality: Right;  . Cesarean section     Family History  Problem Relation Age of Onset  . Cancer Other    History  Substance Use Topics  . Smoking status: Current Every Day Smoker -- 0.50 packs/day for 3 years    Types: Cigarettes  . Smokeless tobacco: Never Used  . Alcohol Use: Yes     Comment: occ.   OB History    Gravida Para Term Preterm AB TAB SAB Ectopic Multiple Living   2 2 2       2      Review of Systems  Constitutional: Negative for fever, chills and appetite change.  HENT: Positive for dental problem and facial swelling. Negative for congestion, ear pain, rhinorrhea, sore throat and trouble swallowing.   Eyes: Negative for pain and visual disturbance.  Gastrointestinal: Negative for nausea and vomiting.  Musculoskeletal: Negative for neck pain and neck stiffness.  Skin: Negative for color change.  Neurological: Negative for dizziness, facial asymmetry and headaches.  Hematological: Negative for adenopathy.  All other systems  reviewed and are negative.     Allergies  Ibuprofen  Home Medications   Prior to Admission medications   Medication Sig Start Date End Date Taking? Authorizing Provider  acetaminophen (TYLENOL) 500 MG tablet Take 1,000 mg by mouth 4 (four) times daily as needed for mild pain. For pain   Yes Historical Provider, MD   BP 148/101 mmHg  Pulse 91  Temp(Src) 98.4 F (36.9 C) (Oral)  Resp 16  Ht 5\' 3"  (1.6 m)  Wt 140 lb (63.504 kg)  BMI 24.81 kg/m2  SpO2 100%  LMP 04/04/2015 Physical Exam  Constitutional: She is oriented to person, place, and time. She appears well-developed and well-nourished. No distress.  HENT:  Head: Normocephalic and atraumatic.  Right Ear: Tympanic membrane and ear canal normal.  Left Ear: Tympanic membrane and ear canal normal.  Mouth/Throat: Uvula is midline, oropharynx is clear and moist and mucous membranes are normal. No trismus in the jaw. Dental caries present. No dental abscesses or uvula swelling.  Tenderness and edema along the left nasolabial fold.  No fluctuance or erythema.  Widespread dental decay and caries.  Teeth are non-tender, no obvious dental abscess, trismus, or sublingual abnml.    Eyes: Conjunctivae and EOM are normal. Pupils are equal, round, and reactive to light.  No periorbital erythema or tenderness.   Neck: Normal range of motion. Neck supple.  Cardiovascular: Normal rate, regular rhythm, normal heart sounds and intact distal pulses.   No murmur heard. Pulmonary/Chest: Effort  normal and breath sounds normal. No respiratory distress.  Musculoskeletal: Normal range of motion.  Lymphadenopathy:    She has no cervical adenopathy.  Neurological: She is alert and oriented to person, place, and time. She exhibits normal muscle tone. Coordination normal.  Skin: Skin is warm and dry.  Nursing note and vitals reviewed.   ED Course  Procedures (including critical care time) Labs Review Labs Reviewed - No data to display  Imaging  Review No results found.   EKG Interpretation None      MDM   Final diagnoses:  Lt facial pain     Pt is non-toxic appearing.  Vitals stable.  No erythema of the face, fever to suggest orbital cellulitis.  No involvement of the eye.  No rash.  Pt agrees to abx and close f/u in 2 days if the sx's worsen.  Strict return precautions given.     Pauline Ausammy Malichi Palardy, PA-C 05/06/15 1557  Rolland PorterMark James, MD 05/13/15 [phone]

## 2015-05-04 NOTE — ED Notes (Signed)
Pain lt side of face with swelling . Says she has 'bad teeth"

## 2015-11-13 IMAGING — CT CT MAXILLOFACIAL W/ CM
3 series · 15 of 47 positions shown, 18 images · IV contrast (Omnipaque 300)
Comparison: Prior study from 06/23/2014.

CLINICAL DATA: Initial evaluation for facial pain and swelling.
Right eye pain.

EXAM:
CT MAXILLOFACIAL WITH CONTRAST
TECHNIQUE: Multidetector CT imaging of the maxillofacial structures was
performed with intravenous contrast. Multiplanar CT image
reconstructions were also generated. A small metallic BB was placed
on the right temple in order to reliably differentiate right from
left.
CONTRAST:  75mL OMNIPAQUE IOHEXOL 300 MG/ML  SOLN

[Series 2: max st axial · axial · 0.31mm/px · z∈[+23,+145]mm · 9 of 71 slices shown, 12 images]
[im 5/71  brain]
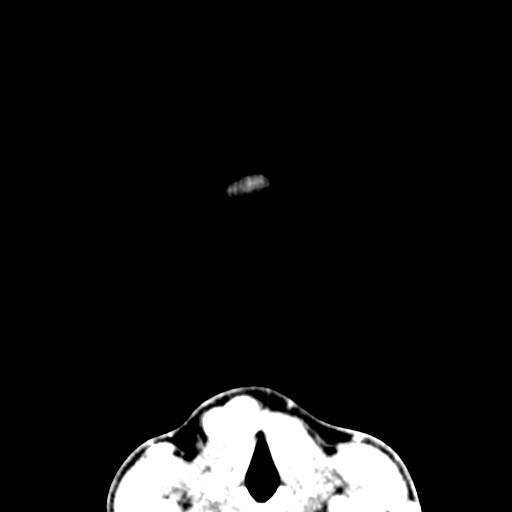
[im 5/71  bone]
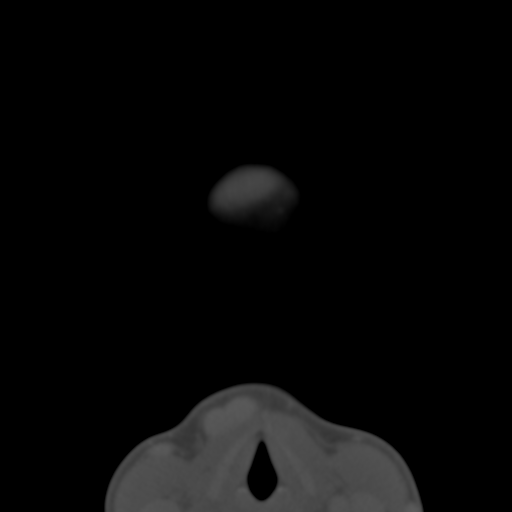
[im 13/71  bone]
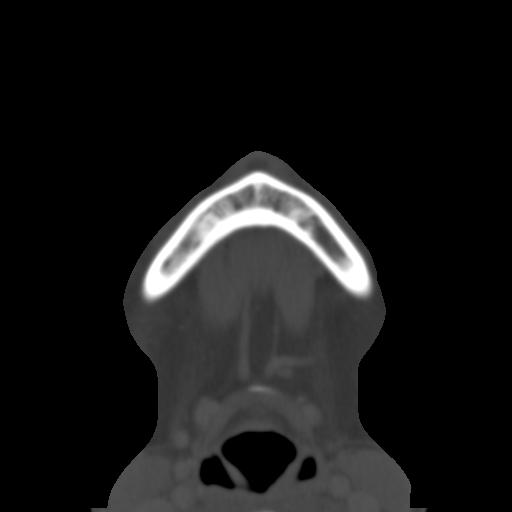
[im 20/71  bone]
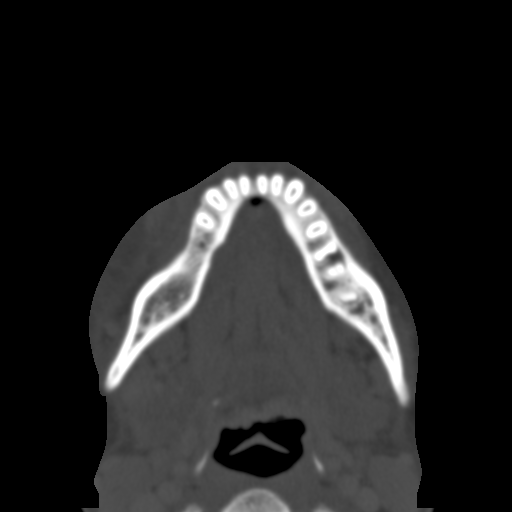
[im 27/71  bone]
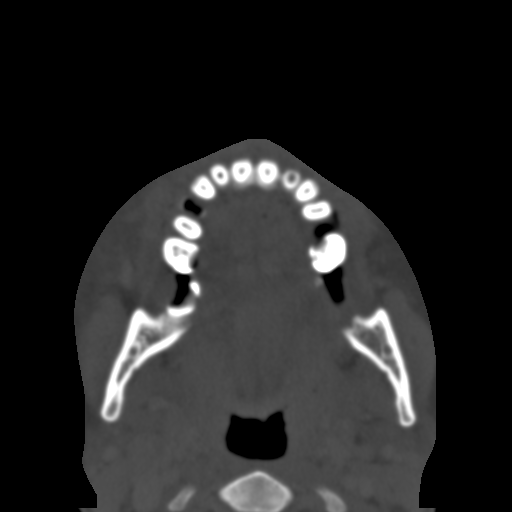
[im 37/71  brain]
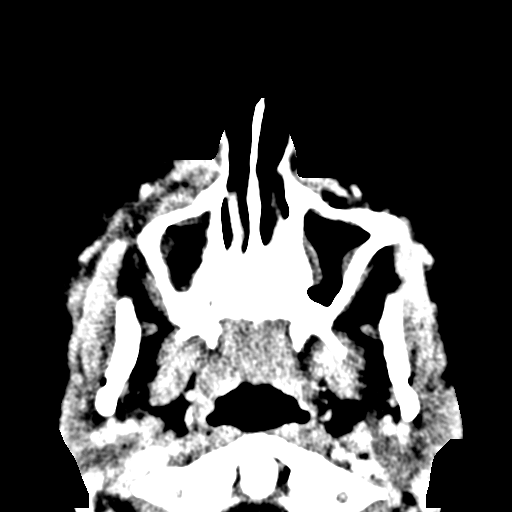
[im 37/71  bone]
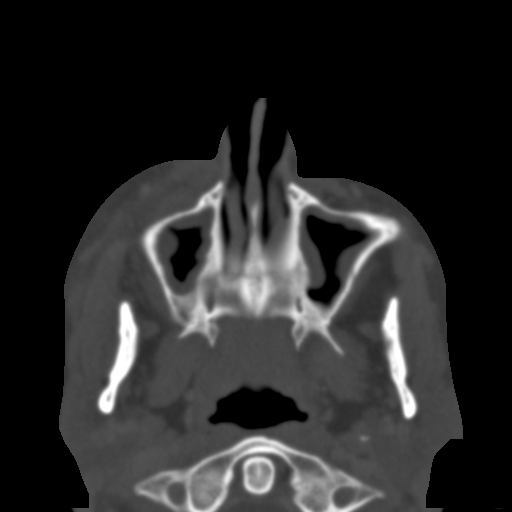
[im 44/71  bone]
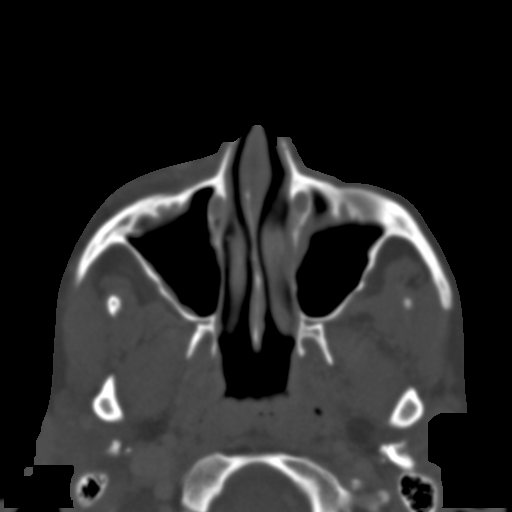
[im 51/71  bone]
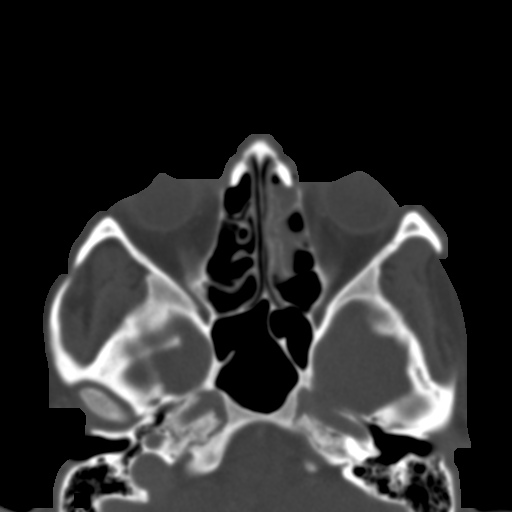
[im 58/71  bone]
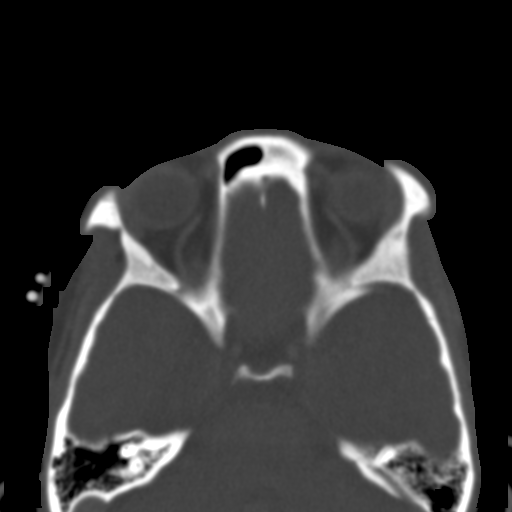
[im 66/71  brain]
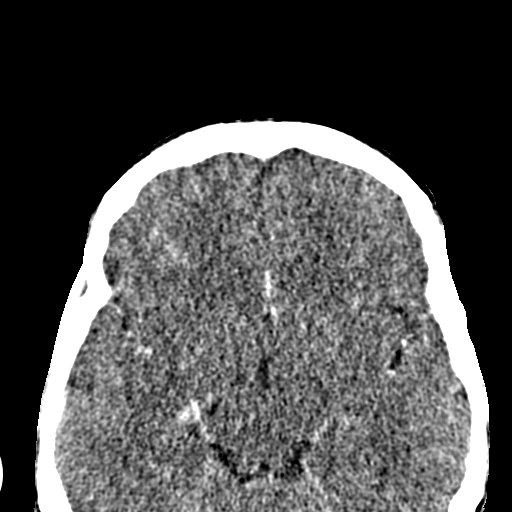
[im 66/71  bone]
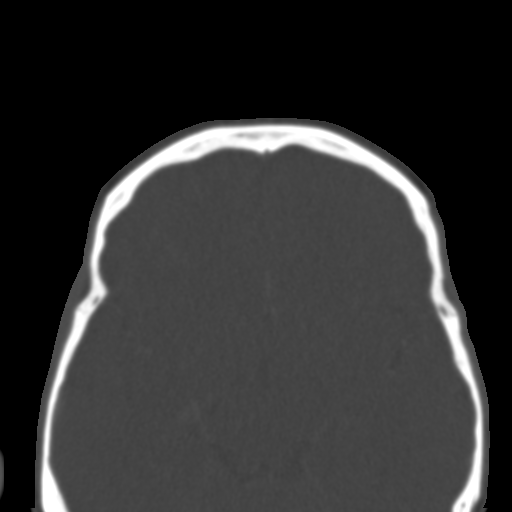

[Series 4: max st coro · coronal · 0.26mm/px · 3 of 65 slices shown]
[im 22/65  bone]
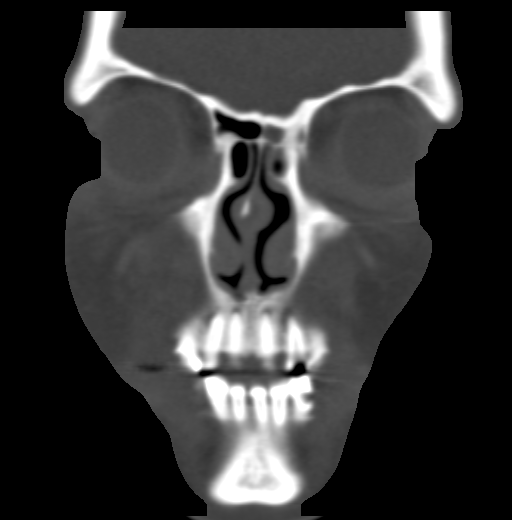
[im 29/65  bone]
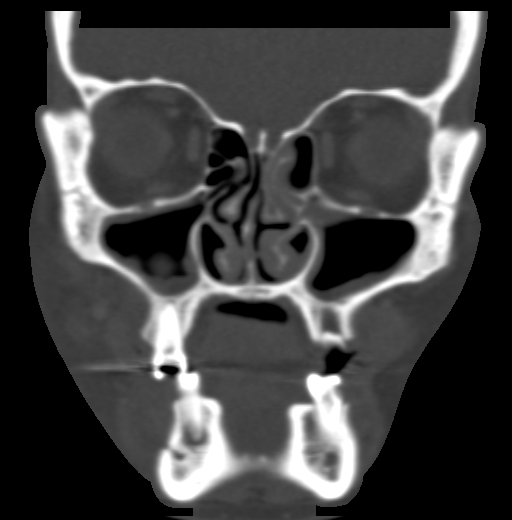
[im 36/65  bone]
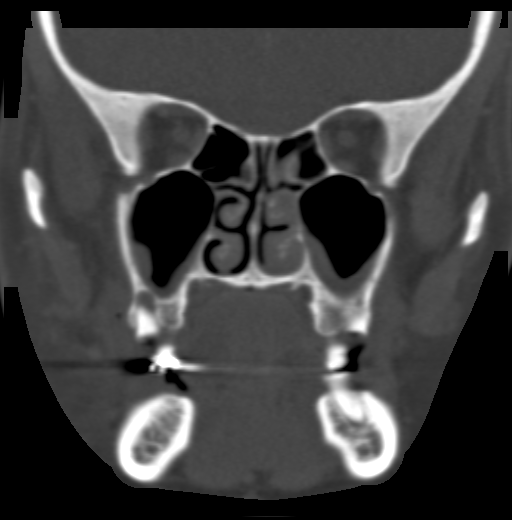

[Series 5: max st sag · sagittal · 0.26mm/px · 3 of 75 slices shown]
[im 25/75  bone]
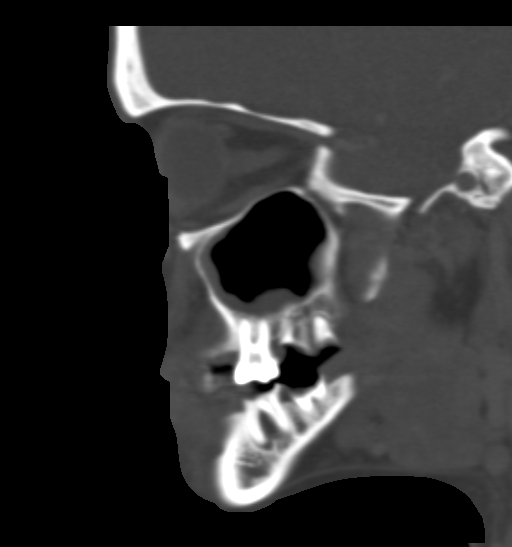
[im 38/75  bone]
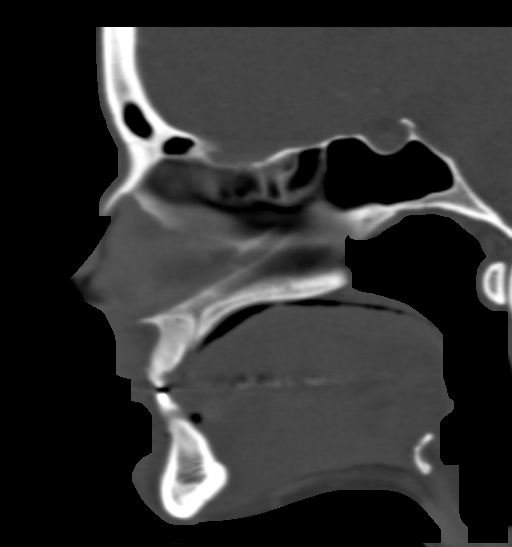
[im 50/75  bone]
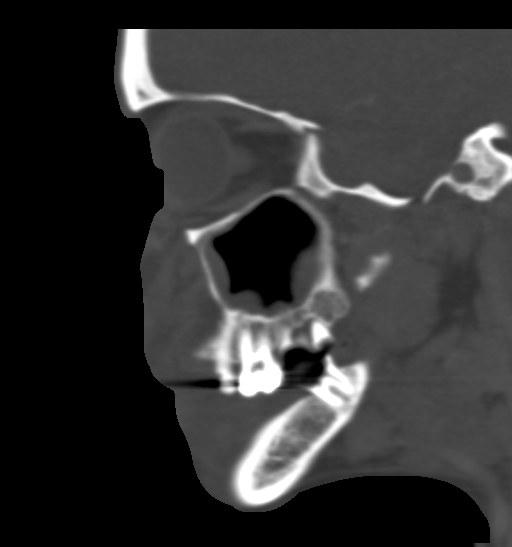

[15 of 47 positions shown; findings below may reference images not displayed]

FINDINGS: Visualized portions of the intracranial contents are unremarkable.

The globes are symmetric in size and appearance without acute
abnormality. The intraconal and extraconal fat are well preserved.
No retro-orbital inflammatory stranding or other process.
Extraocular muscles and optic nerves are normal.

There is asymmetric soft tissue swelling with inflammatory fat
stranding within the right infraorbital soft tissues of the right
face (series 2, image 26). Finding is indeterminate, but could
reflect cellulitis in the correct clinical setting. No postseptal
extension into the bony left orbit. There is mild asymmetric
swelling at right medial canthus extending towards the nasal bridge.
Inferiorly, there is mild asymmetric swelling with inflammatory
stranding within the right cheek. There is slightly more focal
phlegmonous change along the buccal mucosa at the right maxilla
(series 4, image 27), suspicious for possible early developing
subperiosteal abscess no definite drainable fluid collections at
this time.

Anterior left ethmoidal air cells are largely opacified. There is
mild polypoid mucosal thickening within the maxillary sinuses
bilaterally. No air-fluid levels to suggest active sinusitis. The
visualized mastoid air cells are clear.

No acute maxillofacial fracture.  Scattered dental caries noted.
IMPRESSION: 1. Soft tissue swelling with inflammatory stranding within the right
periorbital and facial soft tissues as above, suspicious for
cellulitis. No postseptal extension into the bony right orbit. No
intraorbital involvement within the right globe. More confluent
phlegmonous change along the buccal mucosa adjacent to the right
maxilla suspicious for possible developing odontogenic/subperiosteal
abscess.
2. Left ethmoidal and maxillary sinus disease as above. No air-fluid
levels to suggest active sinusitis.
3. Scattered dental caries.

## 2017-03-15 ENCOUNTER — Encounter (HOSPITAL_COMMUNITY): Payer: Self-pay | Admitting: Emergency Medicine

## 2017-03-15 ENCOUNTER — Emergency Department (HOSPITAL_COMMUNITY)
Admission: EM | Admit: 2017-03-15 | Discharge: 2017-03-15 | Disposition: A | Discharge status: Home / Self Care | Payer: Self-pay | Attending: Emergency Medicine | Admitting: Emergency Medicine

## 2017-03-15 DIAGNOSIS — F17210 Nicotine dependence, cigarettes, uncomplicated: Secondary | ICD-10-CM | POA: Insufficient documentation

## 2017-03-15 DIAGNOSIS — K047 Periapical abscess without sinus: Secondary | ICD-10-CM | POA: Insufficient documentation

## 2017-03-15 MED ORDER — AMOXICILLIN 500 MG PO CAPS: 500.0000 mg | ORAL_CAPSULE | Freq: Three times a day (TID) | ORAL | 0 refills | Status: DC

## 2017-03-15 MED ORDER — AMOXICILLIN 250 MG PO CAPS
500.0000 mg | ORAL_CAPSULE | Freq: Once | ORAL | Status: AC
  Administered 2017-03-15: 500 mg via ORAL
  Filled 2017-03-15: qty 2

## 2017-03-15 MED ORDER — HYDROCODONE-ACETAMINOPHEN 5-325 MG PO TABS: ORAL_TABLET | ORAL | 0 refills | Status: DC

## 2017-03-15 MED ORDER — HYDROCODONE-ACETAMINOPHEN 5-325 MG PO TABS: 2.0000 | ORAL_TABLET | ORAL | 0 refills | Status: DC | PRN

## 2017-03-15 MED ORDER — HYDROCODONE-ACETAMINOPHEN 5-325 MG PO TABS
2.0000 | ORAL_TABLET | Freq: Once | ORAL | Status: AC
  Administered 2017-03-15: 2 via ORAL
  Filled 2017-03-15: qty 2

## 2017-03-15 NOTE — ED Triage Notes (Signed)
Dental pain no dentist will need referrals

## 2017-03-16 NOTE — ED Provider Notes (Signed)
[facility] DEPT Provider Note   CSN: [id number] Arrival date & time: 03/15/17  1851     History   Chief Complaint Chief Complaint  Patient presents with  . Dental Pain    HPI Brittany [name] is a 31 y.o. female.  The history is provided by the patient. No language interpreter was used.  Dental Pain   This is a new problem. The current episode started yesterday. The problem has been gradually worsening. The pain is severe. She has tried nothing for the symptoms. The treatment provided no relief.  Pt complains of swelling to left side of face.  Pt reports several bad teeth.   No past medical history on file.  Patient Active Problem List   Diagnosis Date Noted  . 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 CESAREAN SECTION    . TONSILLECTOMY Right 06/02/2014   Procedure: Irrigation and Drainage Right Floor of Mouth Dental Abscess;  Surgeon: Christia Reading, MD;  Location: [facility] OR;  Service: ENT;  Laterality: Right;    OB History    Gravida Para Term Preterm AB Living   SAB TAB Ectopic Multiple Live Births                   Home Medications    Prior to Admission medications   Medication Sig Start Date End Date Taking? Authorizing Provider  amoxicillin (AMOXIL) 500 MG capsule Take 1 capsule (500 mg total) by mouth 3 (three) times daily. 03/15/17   Elson Areas, PA-C  HYDROcodone-acetaminophen (NORCO/VICODIN) 5-325 MG tablet Take 2 tablets by mouth every 4 (four) hours as needed. 03/15/17   Elson Areas, PA-C  HYDROcodone-acetaminophen (NORCO/VICODIN) 5-325 MG tablet Take one tab po q 4-6 hrs prn pain 03/15/17   Elson Areas, PA-C    Family History Family History  Problem Relation Age of Onset  . Cancer Other     Social History Social History  Substance Use Topics  . Smoking status: Current Every Day Smoker    Packs/day: 0.50    Years: 3.00    Types: Cigarettes  . Smokeless tobacco: Never Used  .  Alcohol use Yes     Comment: occ.     Allergies   Ibuprofen   Review of Systems Review of Systems  All other systems reviewed and are negative.    Physical Exam Updated Vital Signs BP 131/90 (BP Location: Right Arm)   Pulse (!) 119   Temp 99.5 F (37.5 C) (Oral)   Resp 16   Ht  (1.6 m)   Wt 70.3 kg   LMP 03/14/2017   SpO2 99%   BMI 27.46 kg/m   Physical Exam  Constitutional: She is oriented to person, place, and time. She appears well-developed and well-nourished.  HENT:  Head: Normocephalic.  Swollen left lower face and left jaw.  Swollen left lower gumline, broken teeth  Eyes: EOM are normal.  Neck: Normal range of motion.  Pulmonary/Chest: Effort normal.  Abdominal: She exhibits no distension.  Musculoskeletal: Normal range of motion.  Neurological: She is alert and oriented to person, place, and time.  Psychiatric: She has a normal mood and affect.  Nursing note and vitals reviewed.    ED Treatments / Results  Labs (all labs ordered are listed, but only abnormal results are displayed) Labs Reviewed - No data to display  EKG  EKG Interpretation None  Radiology No results found.  Procedures Procedures (including critical care time)  Medications Ordered in ED Medications  amoxicillin (AMOXIL) capsule 500 mg (500 mg Oral Given 03/15/17 2101)  HYDROcodone-acetaminophen (NORCO/VICODIN) 5-325 MG per tablet 2 tablet (2 tablets Oral Given 03/15/17 2101)     Initial Impression / Assessment and Plan / ED Course  I have reviewed the triage vital signs and the nursing notes.  Pertinent labs & imaging results that were available during my care of the patient were reviewed by me and considered in my medical decision making (see chart for details).     Pt given dental referral.  Pt advised she needs to see  Final Clinical Impressions(s) / ED Diagnoses   Final diagnoses:  Dental infection  Dental abscess    New Prescriptions Discharge  Medication List as of 03/15/2017  8:53 PM    START taking these medications   Details  amoxicillin (AMOXIL) 500 MG capsule Take 1 capsule (500 mg total) by mouth 3 (three) times daily., Starting Sat 03/15/2017, Print      An After Visit Summary was printed and given to the patient. Meds ordered this encounter  Medications  . amoxicillin (AMOXIL) capsule 500 mg  . HYDROcodone-acetaminophen (NORCO/VICODIN) 5-325 MG per tablet 2 tablet  . amoxicillin (AMOXIL) 500 MG capsule    Sig: Take 1 capsule (500 mg total) by mouth 3 (three) times daily.    Dispense:  30 capsule    Refill:  0    Order Specific Question:   Supervising Provider    Answer:   MILLER, BRIAN [[id number]]  . HYDROcodone-acetaminophen (NORCO/VICODIN) 5-325 MG tablet    Sig: Take 2 tablets by mouth every 4 (four) hours as needed.    Dispense:  10 tablet    Refill:  0    Order Specific Question:   Supervising Provider    Answer:   Hyacinth Meeker, BRIAN [[id number]]  . HYDROcodone-acetaminophen (NORCO/VICODIN) 5-325 MG tablet    Sig: Take one tab po q 4-6 hrs prn pain    Dispense:  12 tablet    Refill:  0    Order Specific Question:   Supervising Provider    Answer:   Eber Hong [[id number]]     Elson Areas, PA-C 03/16/17 0056    Samuel Jester, DO 03/18/17 2006

## 2017-05-05 ENCOUNTER — Emergency Department (HOSPITAL_COMMUNITY)
Admission: EM | Admit: 2017-05-05 | Discharge: 2017-05-05 | Disposition: A | Discharge status: Home / Self Care | Payer: Self-pay | Attending: Emergency Medicine | Admitting: Emergency Medicine

## 2017-05-05 ENCOUNTER — Encounter (HOSPITAL_COMMUNITY): Payer: Self-pay | Admitting: Emergency Medicine

## 2017-05-05 DIAGNOSIS — F17210 Nicotine dependence, cigarettes, uncomplicated: Secondary | ICD-10-CM | POA: Insufficient documentation

## 2017-05-05 DIAGNOSIS — K047 Periapical abscess without sinus: Secondary | ICD-10-CM | POA: Insufficient documentation

## 2017-05-05 MED ORDER — AMOXICILLIN 500 MG PO CAPS: 500.0000 mg | ORAL_CAPSULE | Freq: Three times a day (TID) | ORAL | 0 refills | Status: AC

## 2017-05-05 MED ORDER — AMOXICILLIN 250 MG PO CAPS
500.0000 mg | ORAL_CAPSULE | Freq: Once | ORAL | Status: AC
  Administered 2017-05-05: 500 mg via ORAL
  Filled 2017-05-05: qty 2

## 2017-05-05 MED ORDER — HYDROCODONE-ACETAMINOPHEN 5-325 MG PO TABS: 1.0000 | ORAL_TABLET | ORAL | 0 refills | Status: DC | PRN

## 2017-05-05 MED ORDER — HYDROCODONE-ACETAMINOPHEN 5-325 MG PO TABS
1.0000 | ORAL_TABLET | Freq: Once | ORAL | Status: AC
  Administered 2017-05-05: 1 via ORAL
  Filled 2017-05-05: qty 1

## 2017-05-05 NOTE — ED Triage Notes (Signed)
Patient complaining of pain and swelling to left jaw area upon awakening this morning. States she has bad teeth in area.

## 2017-05-05 NOTE — ED Provider Notes (Signed)
[facility] Provider Note   CSN: [id number] Arrival date & time: 05/05/17  1754   By signing my name below, I, Clarisse GougeXavier Herndon, attest that this documentation has been prepared under the direction and in the presence of Burgess AmorJulie Tameca Jerez, PA-C. Electronically signed, Clarisse GougeXavier Herndon, ED Scribe. 05/05/17. 7:15 PM.   History   Chief Complaint Chief Complaint  Patient presents with  . Dental Pain   The history is provided by the patient and medical records. No language interpreter was used.    Brittany [name] is a 31 y.o. female with h/o poor dentition and kidney removal who presents to the Emergency Department with concern for gradually worsening, painful L jaw swelling since this morning. 5/10, constant, pressure-like aching described in triage. This pain is worse with contact, eating and drinking. Pt has taken tylenol without relief; ice has mildly relieved her facial swelling. No fever, dental drainage noted. No dentist noted. LNMP "2 weeks ago".  History reviewed. No pertinent past medical history.  Patient Active Problem List   Diagnosis Date Noted  . 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 CESAREAN SECTION    . NEPHRECTOMY    . TONSILLECTOMY Right 06/02/2014   Procedure: Irrigation and Drainage Right Floor of Mouth Dental Abscess;  Surgeon: Christia Readingwight Bates, MD;  [facility];  Service: ENT;  Laterality: Right;    OB History    Gravida Para Term Preterm AB Living   2 2 2     2    SAB TAB Ectopic Multiple Live Births                   Home Medications    Prior to Admission medications   Medication Sig Start Date End Date Taking? Authorizing Provider  amoxicillin (AMOXIL) 500 MG capsule Take 1 capsule (500 mg total) by mouth 3 (three) times daily. 03/15/17   Elson AreasSofia, Leslie K, PA-C  HYDROcodone-acetaminophen (NORCO/VICODIN) 5-325 MG tablet Take 2 tablets by mouth every 4 (four) hours as needed. 03/15/17   Elson AreasSofia, Leslie K, PA-C    HYDROcodone-acetaminophen (NORCO/VICODIN) 5-325 MG tablet Take one tab po q 4-6 hrs prn pain 03/15/17   Elson AreasSofia, Leslie K, PA-C    Family History Family History  Problem Relation Age of Onset  . Cancer Other     Social History Social History  Substance Use Topics  . Smoking status: Current Every Day Smoker    Packs/day: 0.50    Years: 3.00    Types: Cigarettes  . Smokeless tobacco: Never Used  . Alcohol use No     Allergies   Ibuprofen   Review of Systems Review of Systems  Constitutional: Negative for fever.  HENT: Positive for dental problem and facial swelling. Negative for mouth sores.   Gastrointestinal: Negative for nausea and vomiting.  Musculoskeletal: Positive for neck pain.  Skin: Negative for wound.  All other systems reviewed and are negative.    Physical Exam Updated Vital Signs BP 127/72 (BP Location: Right Arm)   Pulse (!) 127   Temp 98.4 F (36.9 C) (Oral)   Resp 17   Ht 5\' 3"  (1.6 m)   Wt 160 lb (72.6 kg)   LMP 04/21/2017   SpO2 97%   BMI 28.34 kg/m   Physical Exam  Constitutional: She is oriented to person, place, and time. She appears well-developed and well-nourished. No distress.  HENT:  Head: Normocephalic and atraumatic.  Right Ear: Tympanic membrane and external ear  normal.  Left Ear: Tympanic membrane and external ear normal.  Mouth/Throat: Uvula is midline, oropharynx is clear and moist and mucous membranes are normal. No oral lesions. No trismus in the jaw. Dental abscesses present.  Poor dentition with advanced widspread dental decay.  TTP with induration along left mandible. No fluctuance, pointing or tenting.  No facial erythema.  Sublingual space is soft.  Eyes: Conjunctivae are normal.  Neck: Normal range of motion. Neck supple.  Cardiovascular: Normal rate and normal heart sounds.   Pulmonary/Chest: Effort normal.  Abdominal: She exhibits no distension.  Musculoskeletal: Normal range of motion.  Lymphadenopathy:    She  has no cervical adenopathy.  Neurological: She is alert and oriented to person, place, and time.  Skin: Skin is warm and dry. No erythema.  Psychiatric: She has a normal mood and affect.     ED Treatments / Results  DIAGNOSTIC STUDIES: Oxygen Saturation is 97% on RA, NL by my interpretation.    COORDINATION OF CARE: 7:02 PM-Discussed next steps with pt. Pt verbalized understanding and is agreeable with the plan. Will order medications and provide dental resources. Pt prepared for d/c, advised of symptomatic care at home, F/U instructions and return precautions.    Labs (all labs ordered are listed, but only abnormal results are displayed) Labs Reviewed - No data to display  EKG  EKG Interpretation None       Radiology No results found.  Procedures Procedures (including critical care time)  Medications Ordered in ED Medications  HYDROcodone-acetaminophen (NORCO/VICODIN) 5-325 MG per tablet 1 tablet (not administered)  amoxicillin (AMOXIL) capsule 500 mg (not administered)     Initial Impression / Assessment and Plan / ED Course  I have reviewed the triage vital signs and the nursing notes.  Pertinent labs & imaging results that were available during my care of the patient were reviewed by me and considered in my medical decision making (see chart for details).     Pt with localized dental abscess without facial cellulitis, sublingual space soft. No palpable drainable abscess. Amoxil, hydrocodone, dental referrals listed.  Discussed return precautions.   Final Clinical Impressions(s) / ED Diagnoses   Final diagnoses:  Dental abscess    New Prescriptions New Prescriptions   No medications on file  I personally performed the services described in this documentation, which was scribed in my presence. The recorded information has been reviewed and is accurate.    Burgess Amor, PA-C 05/05/17 1915    Marily Memos, MD 05/05/17 2253

## 2017-05-05 NOTE — Discharge Instructions (Signed)
Complete your entire course of antibiotics as prescribed.  You  may use the hydrocodone for pain relief but do not drive within 4 hours of taking as this will make you drowsy.  Avoid applying heat or ice to this abscess area which can worsen your symptoms.  You may use warm salt water swish and spit treatment or half peroxide and water swish and spit after meals to keep this area clean as discussed.  Call the dentist listed above for further management of your symptoms. Return here for any worsened swelling, pain or if your symptoms do not respond to the medication.

## 2019-08-17 ENCOUNTER — Emergency Department (HOSPITAL_COMMUNITY): Payer: Self-pay

## 2019-08-17 ENCOUNTER — Encounter (HOSPITAL_COMMUNITY): Payer: Self-pay

## 2019-08-17 ENCOUNTER — Other Ambulatory Visit: Payer: Self-pay

## 2019-08-17 ENCOUNTER — Emergency Department (HOSPITAL_COMMUNITY)
Admission: EM | Admit: 2019-08-17 | Discharge: 2019-08-17 | Disposition: A | Discharge status: Home / Self Care | Payer: Self-pay | Attending: Emergency Medicine | Admitting: Emergency Medicine

## 2019-08-17 DIAGNOSIS — R0989 Other specified symptoms and signs involving the circulatory and respiratory systems: Secondary | ICD-10-CM | POA: Insufficient documentation

## 2019-08-17 DIAGNOSIS — F17210 Nicotine dependence, cigarettes, uncomplicated: Secondary | ICD-10-CM | POA: Insufficient documentation

## 2019-08-17 DIAGNOSIS — R1310 Dysphagia, unspecified: Secondary | ICD-10-CM | POA: Insufficient documentation

## 2019-08-17 LAB — COMPREHENSIVE METABOLIC PANEL
ALT: 10 U/L (ref 0–44)
AST: 15 U/L (ref 15–41)
Albumin: 3.8 g/dL (ref 3.5–5.0)
Alkaline Phosphatase: 96 U/L (ref 38–126)
Anion gap: 9 (ref 5–15)
BUN: 6 mg/dL (ref 6–20)
CO2: 27 mmol/L (ref 22–32)
Calcium: 8.7 mg/dL — ABNORMAL LOW (ref 8.9–10.3)
Chloride: 102 mmol/L (ref 98–111)
Creatinine, Ser: 0.7 mg/dL (ref 0.44–1.00)
GFR calc Af Amer: >60 mL/min (ref >60)
GFR calc non Af Amer: >60 mL/min (ref >60)
Glucose, Bld: 102 mg/dL — ABNORMAL HIGH (ref 70–99)
Potassium: 3.4 mmol/L — ABNORMAL LOW (ref 3.5–5.1)
Sodium: 138 mmol/L (ref 135–145)
Total Bilirubin: 0.1 mg/dL — ABNORMAL LOW (ref 0.3–1.2)
Total Protein: 7.1 g/dL (ref 6.5–8.1)

## 2019-08-17 LAB — CBC WITH DIFFERENTIAL/PLATELET
Abs Immature Granulocytes: 0.01 10*3/uL (ref 0.00–0.07)
Basophils Absolute: 0 10*3/uL (ref 0.0–0.1)
Basophils Relative: 1 %
Eosinophils Absolute: 0.1 10*3/uL (ref 0.0–0.5)
Eosinophils Relative: 2 %
HCT: 38.1 % (ref 36.0–46.0)
Hemoglobin: 11.6 g/dL — ABNORMAL LOW (ref 12.0–15.0)
Immature Granulocytes: 0 %
Lymphocytes Relative: 29 %
Lymphs Abs: 1.9 10*3/uL (ref 0.7–4.0)
MCH: 25.6 pg — ABNORMAL LOW (ref 26.0–34.0)
MCHC: 30.4 g/dL (ref 30.0–36.0)
MCV: 84.1 fL (ref 80.0–100.0)
Monocytes Absolute: 0.5 10*3/uL (ref 0.1–1.0)
Monocytes Relative: 7 %
Neutro Abs: 4.1 10*3/uL (ref 1.7–7.7)
Neutrophils Relative %: 61 %
Platelets: 393 10*3/uL (ref 150–400)
RBC: 4.53 MIL/uL (ref 3.87–5.11)
RDW: 15.6 % — ABNORMAL HIGH (ref 11.5–15.5)
WBC: 6.6 10*3/uL (ref 4.0–10.5)
nRBC: 0 % (ref 0.0–0.2)

## 2019-08-17 LAB — I-STAT BETA HCG BLOOD, ED (MC, WL, AP ONLY): I-stat hCG, quantitative: <5 m[IU]/mL (ref <5)

## 2019-08-17 LAB — LIPASE, BLOOD: Lipase: 26 U/L (ref 11–51)

## 2019-08-17 MED ORDER — ALUM & MAG HYDROXIDE-SIMETH 200-200-20 MG/5ML PO SUSP
30.0000 mL | Freq: Once | ORAL | Status: AC
  Administered 2019-08-17: 30 mL via ORAL
  Filled 2019-08-17: qty 30

## 2019-08-17 MED ORDER — PANTOPRAZOLE SODIUM 40 MG IV SOLR
40.0000 mg | Freq: Once | INTRAVENOUS | Status: AC
  Administered 2019-08-17: 40 mg via INTRAVENOUS
  Filled 2019-08-17: qty 40

## 2019-08-17 MED ORDER — LIDOCAINE VISCOUS HCL 2 % MT SOLN
15.0000 mL | Freq: Once | OROMUCOSAL | Status: AC
  Administered 2019-08-17: 15 mL via ORAL
  Filled 2019-08-17: qty 15

## 2019-08-17 MED ORDER — PANTOPRAZOLE SODIUM 20 MG PO TBEC: 20.0000 mg | DELAYED_RELEASE_TABLET | Freq: Every day | ORAL | 0 refills | Status: DC

## 2019-08-17 MED ORDER — ALUM & MAG HYDROXIDE-SIMETH 400-400-40 MG/5ML PO SUSP: 5.0000 mL | Freq: Four times a day (QID) | ORAL | 0 refills | Status: DC | PRN

## 2019-08-17 NOTE — ED Triage Notes (Signed)
Pt felt like something was stuck in her throat a few days ago. Now she feels like it's stuck in her chest. Pt hasn't been able to eat since then. States she feels like she's choking and it hurts to swallow food. States this has never happened before.

## 2019-08-17 NOTE — Discharge Instructions (Signed)
Take Protonix daily. Use Maalox as needed for discomfort, especially before after eating. Follow-up with the stomach doctor listed below for further evaluation. Return to the emergency room if you develop high fevers, persistent vomiting, severe worsening pain, any new, worsening, concerning symptoms.

## 2019-08-17 NOTE — ED Notes (Addendum)
Pt given crackers and water. 

## 2019-08-17 NOTE — ED Notes (Signed)
ISTAT preg Negative

## 2019-08-17 NOTE — ED Provider Notes (Signed)
[facility] EMERGENCY DEPARTMENT Provider Note   CSN: [id number] Arrival date & time: 08/17/19  1746     History   Chief Complaint Chief Complaint  Patient presents with  . Dysphagia    HPI Burnard HawthorneCourtney L Porter is a 33 y.o. female presenting for evaluation of difficulty swallowing.  Patient states when she woke up several days ago, she felt a little bit sensation in her throat.  Since then, that sensation has moved down her chest and is now under her xiphoid.  She has a constant discomfort there, worse after she eats or drinks.  She has been up 3 times in the past several days, none today.  She denies history of similar previous.  She denies history of heartburn or reflux.  She denies fevers, chills, sore throat, cough, shortness of breath, abdominal pain, urinary symptoms, abnormal bowel movements.  She denies frequent ibuprofen or NSAID use.  She denies history of abdominal problems.  History of kidney donation, no other abdominal surgeries.  She takes no medications daily.  She has not had any solid foods today, but states she was able to tolerate chicken soup yesterday.  She has never seen a GI doctor or had an EGD before. sxs are worse when she first wakes up in the morning.      HPI  History reviewed. No pertinent past medical history.  Patient Active Problem List   Diagnosis Date Noted  . 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 CESAREAN SECTION    . NEPHRECTOMY    . TONSILLECTOMY Right 06/02/2014   Procedure: Irrigation and Drainage Right Floor of Mouth Dental Abscess;  Surgeon: Christia Readingwight Bates, MD;  [facility];  Service: ENT;  Laterality: Right;     OB History    Gravida  2   Para  2   Term  2   Preterm      AB      Living  2     SAB      TAB      Ectopic      Multiple      Live Births               Home Medications    Prior to Admission medications   Medication Sig Start Date End Date Taking?  Authorizing Provider  alum & mag hydroxide-simeth (MAALOX ADVANCED MAX ST) 400-400-40 MG/5ML suspension Take 5 mLs by mouth every 6 (six) hours as needed for indigestion. 08/17/19   Asenath Balash, PA-C  pantoprazole (PROTONIX) 20 MG tablet Take 1 tablet (20 mg total) by mouth daily. 08/17/19   Burrell Hodapp, PA-C  potassium chloride SA (K-DUR,KLOR-CON) 20 MEQ tablet Take 1 tablet (20 mEq total) by mouth 2 (two) times daily. 12/08/14 04/02/15  Dione BoozeGlick, David, MD    Family History Family History  Problem Relation Age of Onset  . Cancer Other     Social History Social History   Tobacco Use  . Smoking status: Current Every Day Smoker    Packs/day: 0.50    Years: 3.00    Pack years: 1.50    Types: Cigarettes  . Smokeless tobacco: Never Used  Substance Use Topics  . Alcohol use: No  . Drug use: No     Allergies   Ibuprofen   Review of Systems Review of Systems  HENT: Positive for trouble swallowing.        Globus sensation  Gastrointestinal: Positive for vomiting.  All other systems reviewed and are negative.    Physical Exam Updated Vital Signs BP (!) 148/116 (BP Location: Right Arm)   Pulse 95   Temp 98.1 F (36.7 C) (Oral)   Resp 16   Ht 5\' 3"  (1.6 m)   Wt 71.2 kg   SpO2 95%   BMI 27.81 kg/m   Physical Exam Vitals signs and nursing note reviewed.  Constitutional:      General: She is not in acute distress.    Appearance: She is well-developed.     Comments: Resting comfortably in the bed in no acute distress  HENT:     Head: Normocephalic and atraumatic.  Eyes:     Extraocular Movements: Extraocular movements intact.     Conjunctiva/sclera: Conjunctivae normal.     Pupils: Pupils are equal, round, and reactive to light.  Neck:     Musculoskeletal: Normal range of motion and neck supple.  Cardiovascular:     Rate and Rhythm: Normal rate and regular rhythm.  Pulmonary:     Effort: Pulmonary effort is normal. No respiratory distress.     Breath  sounds: Normal breath sounds. No wheezing.  Chest:       Comments: Pt reports slight discomfort with palpation over xiphoid.  Abdominal:     General: There is no distension.     Palpations: Abdomen is soft. There is no mass.     Tenderness: There is no abdominal tenderness. There is no guarding or rebound.     Comments: No tenderness palpation the abdomen.  Soft without rigidity, guarding, distention.  Negative rebound.  No signs of peritonitis.   Musculoskeletal: Normal range of motion.  Skin:    General: Skin is warm and dry.     Capillary Refill: Capillary refill takes less than 2 seconds.  Neurological:     Mental Status: She is alert and oriented to person, place, and time.      ED Treatments / Results  Labs (all labs ordered are listed, but only abnormal results are displayed) Labs Reviewed  CBC WITH DIFFERENTIAL/PLATELET - Abnormal; Notable for the following components:      Result Value   Hemoglobin 11.6 (*)    MCH 25.6 (*)    RDW 15.6 (*)    All other components within normal limits  COMPREHENSIVE METABOLIC PANEL - Abnormal; Notable for the following components:   Potassium 3.4 (*)    Glucose, Bld 102 (*)    Calcium 8.7 (*)    Total Bilirubin 0.1 (*)    All other components within normal limits  LIPASE, BLOOD  I-STAT BETA HCG BLOOD, ED ([facility], [facility], [facility] ONLY)    EKG None  Radiology Dg Chest 2 View  Result Date: 08/17/2019 CLINICAL DATA:  Foreign body sensation EXAM: CHEST - 2 VIEW COMPARISON:  10/27/2011 FINDINGS: The heart size and mediastinal contours are within normal limits. Both lungs are clear. The visualized skeletal structures are unremarkable. IMPRESSION: No active cardiopulmonary disease. Electronically Signed   By: Jasmine Pang M.D.   On: 08/17/2019 21:00    Procedures Procedures (including critical care time)  Medications Ordered in ED Medications  pantoprazole (PROTONIX) injection 40 mg (40 mg Intravenous Given 08/17/19 2136)  alum & mag  hydroxide-simeth (MAALOX/MYLANTA) 200-200-20 MG/5ML suspension 30 mL (30 mLs Oral Given 08/17/19 2137)    And  lidocaine (XYLOCAINE) 2 % viscous mouth solution 15 mL (15 mLs Oral Given 08/17/19 2137)     Initial Impression / Assessment and Plan / ED  Course  I have reviewed the triage vital signs and the nursing notes.  Pertinent labs & imaging results that were available during my care of the patient were reviewed by me and considered in my medical decision making (see chart for details).        Patient presenting for evaluation of globus sensation difficulty swallowing.  Physical exam reassuring, she appears nontoxic.  Symptoms are worse when she first wakes up in the morning and after eating, consider reflux.  As symptoms do not began while eating, doubt food bolus or stricture.  Consider PUD and peripheral, though lower suspicion at this time.  Will obtain x-ray to ensure no gross abnormalities or foreign body.  If negative, will treat for reflux with Protonix and GI cocktail.  Basic labs to assess gallbladder and liver function, as well as signs of infection of pancreas.  Xray viewed and interpreted by me, no obvious fbo or abnormality. Will tx symptomatically and reassess.   On reassessment, patient reports symptoms are improving.  Will p.o. challenge.  Labs overall reassuring. Pt tolerating Po without difficulty. Likely dysphagia 2/2 reflux, however odd that pt without pain or burning., as such, will refer to GI. tx with Protonix and maalox at home. At this time, pt appears safe for d/c. Return precautions given. Pt states she understands and agrees to plan.   Final Clinical Impressions(s) / ED Diagnoses   Final diagnoses:  Dysphagia, unspecified type    ED Discharge Orders         Ordered    pantoprazole (PROTONIX) 20 MG tablet  Daily     08/17/19 2154    alum & mag hydroxide-simeth (MAALOX ADVANCED MAX ST) 712-458-09 MG/5ML suspension  Every 6 hours PRN     08/17/19 2154            Franchot Heidelberg, PA-C 08/17/19 2204    Hayden Rasmussen, MD 08/18/19 1022

## 2020-08-03 ENCOUNTER — Ambulatory Visit: Payer: Self-pay | Attending: Internal Medicine

## 2020-08-03 DIAGNOSIS — Z23 Encounter for immunization: Secondary | ICD-10-CM

## 2020-08-03 NOTE — Progress Notes (Signed)
   Covid-19 Vaccination Clinic  Name:  Sianna Garofano    MRN: [id number] DOB: [birth year]  08/03/2020  Ms. Macaluso was observed post Covid-19 immunization for 15 minutes without incident. She was provided with Vaccine Information Sheet and instruction to access the V-Safe system.   Ms. Suppa was instructed to call 911 with any severe reactions post vaccine: Marland Kitchen Difficulty breathing  . Swelling of face and throat  . A fast heartbeat  . A bad rash all over body  . Dizziness and weakness   Immunizations Administered    Name Date Dose VIS Date Route   Pfizer COVID-19 Vaccine 08/03/2020  2:41 PM 0.3 mL 02/09/2019 Intramuscular   Manufacturer: [employer], [employer]   Lot: [id number]   NDC: [address]-4

## 2020-08-24 ENCOUNTER — Ambulatory Visit: Payer: Self-pay

## 2021-06-11 ENCOUNTER — Encounter (HOSPITAL_COMMUNITY): Payer: Self-pay | Admitting: Oral Surgery

## 2021-06-11 NOTE — Progress Notes (Signed)
Unable to reach patient via phone [phone]).  Left a detailed message on machine with instructions for DOS.  DUE TO COVID-19 ONLY ONE VISITOR IS ALLOWED TO COME WITH YOU AND STAY IN THE WAITING ROOM ONLY DURING PRE OP AND PROCEDURE DAY OF SURGERY.   PCP - unknown Cardiologist - n/a  Chest x-ray - n/a EKG - n/a Stress Test - n/a ECHO - n/a Cardiac Cath - n/a  Sleep Study -  n/a CPAP - none  STOP now taking any Aspirin (unless otherwise instructed by your surgeon), Aleve, Naproxen, Ibuprofen, Motrin, Advil, Goody's, BC's, all herbal medications, fish oil, and all vitamins.   Coronavirus Screening Covid test n/a - Ambulatory Surgery

## 2021-06-12 ENCOUNTER — Encounter (HOSPITAL_COMMUNITY): Payer: Self-pay | Admitting: Oral Surgery

## 2021-06-12 ENCOUNTER — Ambulatory Visit (HOSPITAL_COMMUNITY): Payer: Medicaid Other

## 2021-06-12 ENCOUNTER — Ambulatory Visit (HOSPITAL_COMMUNITY): Payer: Medicaid Other | Admitting: Certified Registered"

## 2021-06-12 ENCOUNTER — Ambulatory Visit (HOSPITAL_COMMUNITY)
Admission: RE | Admit: 2021-06-12 | Discharge: 2021-06-12 | Disposition: A | Discharge status: Home / Self Care | Payer: Medicaid Other | Attending: Oral Surgery | Admitting: Oral Surgery

## 2021-06-12 ENCOUNTER — Encounter (HOSPITAL_COMMUNITY)
Admission: RE | Disposition: A | Discharge status: Home / Self Care | Payer: Self-pay | Source: Home / Self Care | Attending: Oral Surgery

## 2021-06-12 ENCOUNTER — Other Ambulatory Visit: Payer: Self-pay

## 2021-06-12 DIAGNOSIS — Z905 Acquired absence of kidney: Secondary | ICD-10-CM | POA: Insufficient documentation

## 2021-06-12 DIAGNOSIS — F17200 Nicotine dependence, unspecified, uncomplicated: Secondary | ICD-10-CM | POA: Insufficient documentation

## 2021-06-12 DIAGNOSIS — K029 Dental caries, unspecified: Secondary | ICD-10-CM | POA: Diagnosis not present

## 2021-06-12 DIAGNOSIS — Z01818 Encounter for other preprocedural examination: Secondary | ICD-10-CM

## 2021-06-12 HISTORY — DX: Headache, unspecified: R51.9

## 2021-06-12 HISTORY — DX: Anxiety disorder, unspecified: F41.9

## 2021-06-12 HISTORY — PX: DENTAL RESTORATION/EXTRACTION WITH X-RAY: SHX5796

## 2021-06-12 LAB — CBC
HCT: 41.8 % (ref 36.0–46.0)
Hemoglobin: 13.5 g/dL (ref 12.0–15.0)
MCH: 29.1 pg (ref 26.0–34.0)
MCHC: 32.3 g/dL (ref 30.0–36.0)
MCV: 90.1 fL (ref 80.0–100.0)
Platelets: 292 10*3/uL (ref 150–400)
RBC: 4.64 MIL/uL (ref 3.87–5.11)
RDW: 14.6 % (ref 11.5–15.5)
WBC: 8.4 10*3/uL (ref 4.0–10.5)
nRBC: 0 % (ref 0.0–0.2)

## 2021-06-12 LAB — COMPREHENSIVE METABOLIC PANEL
ALT: 15 U/L (ref 0–44)
AST: 20 U/L (ref 15–41)
Albumin: 3.7 g/dL (ref 3.5–5.0)
Alkaline Phosphatase: 54 U/L (ref 38–126)
Anion gap: 13 (ref 5–15)
BUN: 7 mg/dL (ref 6–20)
CO2: 23 mmol/L (ref 22–32)
Calcium: 9.2 mg/dL (ref 8.9–10.3)
Chloride: 104 mmol/L (ref 98–111)
Creatinine, Ser: 0.77 mg/dL (ref 0.44–1.00)
GFR, Estimated: >60 mL/min (ref >60)
Glucose, Bld: 106 mg/dL — ABNORMAL HIGH (ref 70–99)
Potassium: 3.8 mmol/L (ref 3.5–5.1)
Sodium: 140 mmol/L (ref 135–145)
Total Bilirubin: 0.2 mg/dL — ABNORMAL LOW (ref 0.3–1.2)
Total Protein: 6.2 g/dL — ABNORMAL LOW (ref 6.5–8.1)

## 2021-06-12 LAB — POCT PREGNANCY, URINE: Preg Test, Ur: NEGATIVE

## 2021-06-12 SURGERY — DENTAL RESTORATION/EXTRACTION WITH X-RAY
Anesthesia: General

## 2021-06-12 MED ORDER — CEFAZOLIN SODIUM-DEXTROSE 2-4 GM/100ML-% IV SOLN
2.0000 g | INTRAVENOUS | Status: AC
  Administered 2021-06-12: 09:00:00 2 g via INTRAVENOUS
  Filled 2021-06-12: qty 100

## 2021-06-12 MED ORDER — 0.9 % SODIUM CHLORIDE (POUR BTL) OPTIME
TOPICAL | Status: DC | PRN
  Administered 2021-06-12: 1000 mL

## 2021-06-12 MED ORDER — OXYMETAZOLINE HCL 0.05 % NA SOLN
NASAL | Status: AC
  Filled 2021-06-12: qty 30

## 2021-06-12 MED ORDER — DEXMEDETOMIDINE (PRECEDEX) IN NS 20 MCG/5ML (4 MCG/ML) IV SYRINGE
PREFILLED_SYRINGE | INTRAVENOUS | Status: DC | PRN
  Administered 2021-06-12 (×5): 4 ug via INTRAVENOUS

## 2021-06-12 MED ORDER — DEXAMETHASONE SODIUM PHOSPHATE 10 MG/ML IJ SOLN
INTRAMUSCULAR | Status: DC | PRN
  Administered 2021-06-12: 5 mg via INTRAVENOUS

## 2021-06-12 MED ORDER — MIDAZOLAM HCL 5 MG/5ML IJ SOLN
INTRAMUSCULAR | Status: DC | PRN
  Administered 2021-06-12: 2 mg via INTRAVENOUS

## 2021-06-12 MED ORDER — ORAL CARE MOUTH RINSE: 15.0000 mL | Freq: Once | OROMUCOSAL | Status: AC

## 2021-06-12 MED ORDER — ROCURONIUM BROMIDE 10 MG/ML (PF) SYRINGE
PREFILLED_SYRINGE | INTRAVENOUS | Status: DC | PRN
  Administered 2021-06-12: 80 mg via INTRAVENOUS

## 2021-06-12 MED ORDER — MIDAZOLAM HCL 2 MG/2ML IJ SOLN
INTRAMUSCULAR | Status: AC
  Filled 2021-06-12: qty 2

## 2021-06-12 MED ORDER — FENTANYL CITRATE (PF) 250 MCG/5ML IJ SOLN
INTRAMUSCULAR | Status: AC
  Filled 2021-06-12: qty 5

## 2021-06-12 MED ORDER — PROPOFOL 10 MG/ML IV BOLUS
INTRAVENOUS | Status: AC
  Filled 2021-06-12: qty 20

## 2021-06-12 MED ORDER — AMOXICILLIN 500 MG PO CAPS: 500.0000 mg | ORAL_CAPSULE | Freq: Three times a day (TID) | ORAL | 0 refills | Status: AC

## 2021-06-12 MED ORDER — OXYCODONE-ACETAMINOPHEN 5-325 MG PO TABS: 1.0000 | ORAL_TABLET | ORAL | 0 refills | Status: AC | PRN

## 2021-06-12 MED ORDER — SUGAMMADEX SODIUM 200 MG/2ML IV SOLN
INTRAVENOUS | Status: DC | PRN
  Administered 2021-06-12 (×2): 200 mg via INTRAVENOUS

## 2021-06-12 MED ORDER — LACTATED RINGERS IV SOLN: INTRAVENOUS | Status: DC

## 2021-06-12 MED ORDER — LIDOCAINE 2% (20 MG/ML) 5 ML SYRINGE
INTRAMUSCULAR | Status: AC
  Filled 2021-06-12: qty 5

## 2021-06-12 MED ORDER — CHLORHEXIDINE GLUCONATE 0.12 % MT SOLN
15.0000 mL | Freq: Once | OROMUCOSAL | Status: AC
  Administered 2021-06-12: 15 mL via OROMUCOSAL
  Filled 2021-06-12: qty 15

## 2021-06-12 MED ORDER — LIDOCAINE-EPINEPHRINE 2 %-1:100000 IJ SOLN
INTRAMUSCULAR | Status: DC | PRN
  Administered 2021-06-12: 12 mL via INTRADERMAL

## 2021-06-12 MED ORDER — LIDOCAINE-EPINEPHRINE 2 %-1:100000 IJ SOLN
INTRAMUSCULAR | Status: AC
  Filled 2021-06-12: qty 1

## 2021-06-12 MED ORDER — LIDOCAINE 2% (20 MG/ML) 5 ML SYRINGE
INTRAMUSCULAR | Status: DC | PRN
  Administered 2021-06-12: 80 mg via INTRAVENOUS

## 2021-06-12 MED ORDER — PROPOFOL 10 MG/ML IV BOLUS
INTRAVENOUS | Status: DC | PRN
  Administered 2021-06-12: 150 mg via INTRAVENOUS

## 2021-06-12 MED ORDER — FENTANYL CITRATE (PF) 250 MCG/5ML IJ SOLN
INTRAMUSCULAR | Status: DC | PRN
  Administered 2021-06-12: 100 ug via INTRAVENOUS
  Administered 2021-06-12 (×3): 50 ug via INTRAVENOUS

## 2021-06-12 MED ORDER — ROCURONIUM BROMIDE 10 MG/ML (PF) SYRINGE
PREFILLED_SYRINGE | INTRAVENOUS | Status: AC
  Filled 2021-06-12: qty 10

## 2021-06-12 MED ORDER — ONDANSETRON HCL 4 MG/2ML IJ SOLN
INTRAMUSCULAR | Status: DC | PRN
  Administered 2021-06-12: 4 mg via INTRAVENOUS

## 2021-06-12 MED ORDER — OXYMETAZOLINE HCL 0.05 % NA SOLN
NASAL | Status: DC | PRN
  Administered 2021-06-12: 1

## 2021-06-12 SURGICAL SUPPLY — 37 items
BLADE SURG 15 STRL LF DISP TIS (BLADE) ×1 IMPLANT
BLADE SURG 15 STRL SS (BLADE) ×2
BUR CROSS CUT FISSURE 1.6 (BURR) ×2 IMPLANT
BUR CROSS CUT FISSURE 1.6MM (BURR) ×1
BUR EGG ELITE 4.0 (BURR) ×4 IMPLANT
BUR EGG ELITE 4.0MM (BURR) ×2
CANISTER SUCT 3000ML PPV (MISCELLANEOUS) ×3 IMPLANT
COVER SURGICAL LIGHT HANDLE (MISCELLANEOUS) ×3 IMPLANT
COVER WAND RF STERILE (DRAPES) IMPLANT
DECANTER SPIKE VIAL GLASS SM (MISCELLANEOUS) ×3 IMPLANT
DRAPE U-SHAPE 76X120 STRL (DRAPES) ×3 IMPLANT
GAUZE PACKING FOLDED 2  STR (GAUZE/BANDAGES/DRESSINGS) ×2
GAUZE PACKING FOLDED 2 STR (GAUZE/BANDAGES/DRESSINGS) ×1 IMPLANT
GLOVE SURG ENC MOIS LTX SZ6.5 (GLOVE) IMPLANT
GLOVE SURG ENC MOIS LTX SZ7 (GLOVE) IMPLANT
GLOVE SURG ENC MOIS LTX SZ8 (GLOVE) ×3 IMPLANT
GLOVE SURG UNDER POLY LF SZ6.5 (GLOVE) IMPLANT
GLOVE SURG UNDER POLY LF SZ7 (GLOVE) IMPLANT
GOWN STRL REUS W/ TWL LRG LVL3 (GOWN DISPOSABLE) ×1 IMPLANT
GOWN STRL REUS W/ TWL XL LVL3 (GOWN DISPOSABLE) ×1 IMPLANT
GOWN STRL REUS W/TWL LRG LVL3 (GOWN DISPOSABLE) ×2
GOWN STRL REUS W/TWL XL LVL3 (GOWN DISPOSABLE) ×2
IV NS 1000ML (IV SOLUTION) ×2
IV NS 1000ML BAXH (IV SOLUTION) ×1 IMPLANT
KIT BASIN OR (CUSTOM PROCEDURE TRAY) ×3 IMPLANT
KIT TURNOVER KIT B (KITS) ×3 IMPLANT
NEEDLE HYPO 25GX1X1/2 BEV (NEEDLE) ×6 IMPLANT
NS IRRIG 1000ML POUR BTL (IV SOLUTION) ×3 IMPLANT
PAD ARMBOARD 7.5X6 YLW CONV (MISCELLANEOUS) ×3 IMPLANT
SLEEVE IRRIGATION ELITE 7 (MISCELLANEOUS) ×3 IMPLANT
SPONGE SURGIFOAM ABS GEL 12-7 (HEMOSTASIS) IMPLANT
SUT CHROMIC 3 0 PS 2 (SUTURE) ×6 IMPLANT
SYR BULB IRRIG 60ML STRL (SYRINGE) ×3 IMPLANT
SYR CONTROL 10ML LL (SYRINGE) ×3 IMPLANT
TRAY ENT MC OR (CUSTOM PROCEDURE TRAY) ×3 IMPLANT
TUBING IRRIGATION (MISCELLANEOUS) ×3 IMPLANT
YANKAUER SUCT BULB TIP NO VENT (SUCTIONS) ×3 IMPLANT

## 2021-06-12 NOTE — Op Note (Signed)
06/12/2021  9:51 AM  PATIENT:  Brittany [name]  35 y.o. female  PRE-OPERATIVE DIAGNOSIS:  NON RESTORABLE TEETH  #2, 3, 4, 5, 6, 7, 8, 9, 10,11,12, 13, 14, 15,19, 20, 21, 22, 23, 24, 25, 26, 27, 28, 30, 32,   POST-OPERATIVE DIAGNOSIS:  SAME  PROCEDURE:  Procedure(s): DENTAL EXTRACTION TEETH #2, 3, 4, 5, 6, 7, 8, 9, 10, 11,12, 13, 14, 15,19, 20, 21, 22, 23, 24, 25, 26, 27, 28, 30, 32, WITH ALVEOLOLASTY RIGHT AND LEFT MAXILLA AND MANDIBLE  SURGEON:  Surgeon(s): Ocie Doyne, DMD  ANESTHESIA:   local and general  EBL:  minimal  DRAINS: none   SPECIMEN:  No Specimen  COUNTS:  YES  PLAN OF CARE: Discharge to home after PACU  PATIENT DISPOSITION:  PACU - hemodynamically stable.   PROCEDURE DETAILS: Dictation # [id number]  Georgia Lopes, DMD 06/12/2021 9:51 AM

## 2021-06-12 NOTE — H&P (Signed)
HISTORY AND PHYSICAL  Brittany [name] is a 35 y.o. female patient with CC: painful teeth.  1. 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 *  Vitals: Blood pressure 131/64, pulse 80, temperature 97.7 F (36.5 C), temperature source Oral, resp. rate 17, height 5\' 3"  (1.6 m), weight 81.6 kg, last menstrual period 01/16/2021, SpO2 98 %. Lab results: Results for orders placed or performed during the hospital encounter of 06/12/21 (from the past 24 hour(s))  CBC per protocol     Status: None   Collection Time: 06/12/21  6:38 AM  Result Value Ref Range   WBC 8.4 4.0 - 10.5 K/uL   RBC 4.64 3.87 - 5.11 MIL/uL   Hemoglobin 13.5 12.0 - 15.0 g/dL   HCT 06/14/21 79.3 - 90.3 %   MCV 90.1 80.0 - 100.0 fL   MCH 29.1 26.0 - 34.0 pg   MCHC 32.3 30.0 - 36.0 g/dL   RDW 00.9 23.3 - 00.7 %   Platelets 292 150 - 400 K/uL   nRBC 0.0 0.0 - 0.2 %  Comprehensive metabolic panel per protocol     Status: Abnormal   Collection Time: 06/12/21  6:38 AM  Result Value Ref Range   Sodium 140 135 - 145 mmol/L   Potassium 3.8 3.5 - 5.1 mmol/L   Chloride 104 98 - 111 mmol/L   CO2 23 22 - 32 mmol/L   Glucose, Bld 106 (H) 70 - 99 mg/dL   BUN 7 6 - 20 mg/dL   Creatinine, Ser 06/14/21 0.44 - 1.00 mg/dL   Calcium 9.2 8.9 - 6.33 mg/dL   Total Protein 6.2 (L) 6.5 - 8.1 g/dL   Albumin 3.7 3.5 - 5.0 g/dL   AST 20 15 - 41 U/L   ALT 15 0 - 44 U/L   Alkaline Phosphatase 54 38 - 126 U/L    Total Bilirubin 0.2 (L) 0.3 - 1.2 mg/dL   GFR, Estimated 35.4 >56 mL/min   Anion gap 13 5 - 15  Pregnancy, urine POC     Status: None   Collection Time: 06/12/21  6:57 AM  Result Value Ref Range   Preg Test, Ur NEGATIVE NEGATIVE   Radiology Results: DG Orthopantogram  Result Date: 06/12/2021 CLINICAL DATA:  Dental caries.  Preop extractions EXAM: ORTHOPANTOGRAM/PANORAMIC COMPARISON:  None. FINDINGS: Extensive caries throughout multiple teeth. Numerous periapical lucency around multiple upper and lower teeth bilaterally. No fracture.  Mandibular condyles normal in position IMPRESSION: Extensive caries. Extensive periapical lucency around upper and lower teeth. Electronically Signed   By: 06/14/2021 M.D.   On: 06/12/2021 08:00   General appearance: alert, cooperative, and no distress Head: Normocephalic, without obvious abnormality, atraumatic Eyes: negative Nose: Nares normal. Septum midline. Mucosa normal. No drainage or sinus tenderness. Throat: Multiple grossly carious teeth. No purulence, fluctuance, edema, trismus. Pharynx clear.  Neck: no adenopathy and supple, symmetrical, trachea midline Resp: clear to auscultation bilaterally Cardio: regular rate and rhythm, S1, S2 normal, no murmur, click, rub or gallop  Assessment: Multiple non-restorable teeth secondary to dental caries.   Plan: Full mouth extractions with alveoloplasty. GA, Day surgery.   Ocie Doyne 06/12/2021

## 2021-06-12 NOTE — Anesthesia Procedure Notes (Signed)
Procedure Name: Intubation Date/Time: 06/12/2021 9:00 AM Performed by: Myna Bright, CRNA Pre-anesthesia Checklist: Patient identified, Emergency Drugs available, Suction available and Patient being monitored Patient Re-evaluated:Patient Re-evaluated prior to induction Oxygen Delivery Method: Circle system utilized Preoxygenation: Pre-oxygenation with 100% oxygen Induction Type: IV induction Ventilation: Mask ventilation without difficulty Laryngoscope Size: Mac and 4 Grade View: Grade I Nasal Tubes: Right, Magill forceps- large, utilized and Nasal prep performed Tube size: 7.0 mm Number of attempts: 1 Placement Confirmation: positive ETCO2, breath sounds checked- equal and bilateral and ETT inserted through vocal cords under direct vision Tube secured with: Tape Dental Injury: Teeth and Oropharynx as per pre-operative assessment

## 2021-06-12 NOTE — Anesthesia Preprocedure Evaluation (Signed)
Anesthesia Evaluation  Patient identified by MRN, date of birth, ID band Patient awake    Reviewed: Allergy & Precautions, H&P , NPO status , Patient's Chart, lab work & pertinent test results  History of Anesthesia Complications Negative for: history of anesthetic complications  Airway Mallampati: II  TM Distance: >3 FB Neck ROM: Full    Dental  (+) Poor Dentition, Dental Advisory Given, Teeth Intact   Pulmonary Current Smoker and Patient abstained from smoking.,    breath sounds clear to auscultation       Cardiovascular Exercise Tolerance: Good  Rhythm:Regular Rate:Normal     Neuro/Psych  Headaches, Anxiety    GI/Hepatic   Endo/Other    Renal/GU Nephrectomy living donor     Musculoskeletal   Abdominal (+) + obese,   Peds  Hematology   Anesthesia Other Findings   Reproductive/Obstetrics                             Anesthesia Physical  Anesthesia Plan  ASA: 2  Anesthesia Plan: General   Post-op Pain Management:    Induction: Intravenous  PONV Risk Score and Plan: 2 and Ondansetron, Midazolam and Treatment may vary due to age or medical condition  Airway Management Planned: Nasal ETT  Additional Equipment:   Intra-op Plan:   Post-operative Plan: Extubation in OR  Informed Consent: I have reviewed the patients History and Physical, chart, labs and discussed the procedure including the risks, benefits and alternatives for the proposed anesthesia with the patient or authorized representative who has indicated his/her understanding and acceptance.     Dental advisory given  Plan Discussed with: CRNA and Anesthesiologist  Anesthesia Plan Comments:         Anesthesia Quick Evaluation

## 2021-06-12 NOTE — Op Note (Signed)
Brittany [name], Brittany [name] MEDICAL RECORD NO: [id number] ACCOUNT NO: [id number] DATE OF BIRTH: 1986/05/30 FACILITY: [facility] LOCATION: [facility] PHYSICIAN: Georgia Lopes, DDS  Operative Report   DATE OF PROCEDURE: 06/12/2021  PREOPERATIVE DIAGNOSIS:  Nonrestorable teeth secondary to dental caries, numbers 2, 3, 4, 5, 6, 7, 8, 9, 10, 11, 12, 13, 14, 15, 19, 20, 21, 22, 23, 24, 25, 26, 27, 28, 30, 32.  POSTOPERATIVE DIAGNOSIS:  Nonrestorable teeth secondary to dental caries, numbers 2, 3, 4, 5, 6, 7, 8, 9, 10, 11, 12, 13, 14, 15, 19, 20, 21, 22, 23, 24, 25, 26, 27, 28, 30, 32.  PROCEDURE:  Extraction of teeth numbers 2, 3, 4, 5, 6, 7, 8, 9, 10, 11, 12, 13, 14, 15, 19, 20, 21, 22, 23, 24, 25, 26, 27, 28, 30, 32; alveoplasty right and left maxilla and mandible.  SURGEON:  Georgia Lopes, DDS  ANESTHESIA:  General nasal intubation. Dr. Anitra Lauth, attending.  DESCRIPTION OF PROCEDURE:  The patient was taken to the operating room and placed on the table in supine position.  General anesthesia was administered and a nasal endotracheal tube was placed and secured.  The eyes were protected and the patient was  draped for surgery.  Timeout was performed.  The posterior pharynx was suctioned and a throat pack was placed. 2% lidocaine 1:100,000 epinephrine was infiltrated in the inferior alveolar block on the right and left sides in a buccal and palatal  infiltration around the maxillary teeth to be removed.  The bite block was placed on the right side of the mouth and a sweetheart retractor was used to retract the tongue.  A 15 blade was used to make an incision beginning approximately 1 cm proximal to  tooth #19 on the alveolar crest and carried forward in the buccal and lingual sulcus of teeth numbers 19, 20, 21, 22, 23, 24, 25 and 26.  Periosteum was reflected and then the teeth were elevated with a 301 elevator and removed with dental rongeurs or  dental forceps.  The sockets were curetted.  The  periosteum was further reflected to expose the alveolar crest, which was irregular in contour and alveoplasty was performed using the egg bur followed by the bone file to further smooth the area.  The left  mandible was closed with 3-0 chromic.  Then, the left maxilla was operated on.  The 15 blade was used to make an incision around teeth numbers 15, 14, 13, 12, 11, 10, 9, 8 and 7 in the buccal and palatal sulcus.  The periosteum was reflected.  The teeth  were elevated with a 301 elevator and removed from the mouth with a dental forceps or rongeurs.  Then, the sockets were curetted.  The periosteum was reflected to expose the alveolar crest.  There were undercuts posteriorly and egg shaped bur and the  Stryker handpiece under irrigation was used to perform alveoplasty to obtain a smooth contour of the maxilla without undercuts.  Then, the bone file was used to further smooth the area.  Then, the area was irrigated and closed with 3-0 chromic.  The bite  block and sweetheart retractor were repositioned to the other side of the mouth.  A 15 blade was used to make an incision around tooth #32 carried forward to tooth #30 and then to tooth #28 and 27.  The periosteum was reflected.  The teeth were elevated  and removed with dental forceps.  The sockets were curetted.  The tissue was reflected  to expose the alveolar ridge, which had bony irregularity around tooth #27 due to the canine eminence.  Alveoplasty was performed through this area and the rest of  the right mandible and then the area was further smoothed with a bone file.  Then, the area was irrigated and closed with 3-0 chromic in the right maxilla.  The incision was created around teeth numbers 2, 3, 4, 5 and 6.  Periosteum was reflected.  The  teeth were elevated with a 301 elevator and removed from the mouth with rongeurs or dental forceps.  Then, the sockets were curetted and tissue was reflected to expose the alveolar crest.  Alveoplasty was  performed using the egg bur followed by the bone  file.  Then, the area was irrigated and closed with 3-0 chromic.  Then, the oral cavity was irrigated and suctioned.  The throat pack was removed.  The patient was left under the care of anesthesia for extubation and transport to recovery with plans for  discharge home through day surgery.  ESTIMATED BLOOD LOSS:  Minimal.  COMPLICATIONS:  None.  SPECIMENS:  None.   NIK D: 06/12/2021 9:58:12 am T: 06/12/2021 10:39:00 am  JOB: [id number]/ [id number]

## 2021-06-12 NOTE — Transfer of Care (Signed)
Immediate Anesthesia Transfer of Care Note  Patient: Brittany [name]  Procedure(s) Performed: DENTAL EXTRACTION TEETH #2, 3, 4, 5,6,7,8,9,10,11,12,13,14,15,19,20,21,22,23,24,25,26,27,28,30, 32, WITH ALVEOPLASTY  Patient Location: PACU  Anesthesia Type:General  Level of Consciousness: awake, alert , oriented and patient cooperative  Airway & Oxygen Therapy: Patient Spontanous Breathing and Patient connected to face mask oxygen  Post-op Assessment: Report given to RN, Post -op Vital signs reviewed and stable and Patient moving all extremities  Post vital signs: Reviewed and stable  Last Vitals:  Vitals Value Taken Time  BP 99/50 06/12/21 1001  Temp    Pulse 88 06/12/21 1004  Resp 12 06/12/21 1004  SpO2 99 % 06/12/21 1004  Vitals shown include unvalidated device data.  Last Pain:  Vitals:   06/12/21 0652  TempSrc:   PainSc: 0-No pain      Patients Stated Pain Goal: 3 (06/12/21 8325)  Complications: No notable events documented.

## 2021-06-12 NOTE — Anesthesia Postprocedure Evaluation (Signed)
Anesthesia Post Note  Patient: Brittany [name]  Procedure(s) Performed: DENTAL EXTRACTION TEETH #2, 3, 4, 5,6,7,8,9,10,11,12,13,14,15,19,20,21,22,23,24,25,26,27,28,30, 32, WITH ALVEOPLASTY     Patient location during evaluation: PACU Anesthesia Type: General Level of consciousness: awake and alert Pain management: pain level controlled Vital Signs Assessment: post-procedure vital signs reviewed and stable Respiratory status: spontaneous breathing, nonlabored ventilation and respiratory function stable Cardiovascular status: blood pressure returned to baseline and stable Postop Assessment: no apparent nausea or vomiting Anesthetic complications: no   No notable events documented.  Last Vitals:  Vitals:   06/12/21 1015 06/12/21 1030  BP: (!) 105/59 107/65  Pulse: 81 72  Resp: 16 11  Temp:  36.6 C  SpO2: 99% 99%    Last Pain:  Vitals:   06/12/21 1030  TempSrc:   PainSc: 0-No pain                 Lowella Curb

## 2021-06-12 NOTE — H&P (Signed)
Anesthesia H&P Update: History and Physical Exam reviewed; patient is OK for planned anesthetic and procedure. ? ?

## 2021-06-13 ENCOUNTER — Encounter (HOSPITAL_COMMUNITY): Payer: Self-pay | Admitting: Oral Surgery

## 2023-09-01 ENCOUNTER — Emergency Department (HOSPITAL_BASED_OUTPATIENT_CLINIC_OR_DEPARTMENT_OTHER)
Admission: EM | Admit: 2023-09-01 | Discharge: 2023-09-01 | Disposition: A | Discharge status: Home / Self Care | Payer: Medicaid Other | Attending: Emergency Medicine | Admitting: Emergency Medicine

## 2023-09-01 ENCOUNTER — Other Ambulatory Visit: Payer: Self-pay

## 2023-09-01 DIAGNOSIS — L02411 Cutaneous abscess of right axilla: Secondary | ICD-10-CM | POA: Insufficient documentation

## 2023-09-01 DIAGNOSIS — L0291 Cutaneous abscess, unspecified: Secondary | ICD-10-CM

## 2023-09-01 MED ORDER — DOXYCYCLINE HYCLATE 100 MG PO CAPS: 100.0000 mg | ORAL_CAPSULE | Freq: Two times a day (BID) | ORAL | 0 refills | Status: AC

## 2023-09-01 MED ORDER — LIDOCAINE-EPINEPHRINE (PF) 2 %-1:200000 IJ SOLN
10.0000 mL | Freq: Once | INTRAMUSCULAR | Status: AC
  Administered 2023-09-01: 10 mL via INTRADERMAL
  Filled 2023-09-01: qty 20

## 2023-09-01 NOTE — ED Notes (Signed)
ED Provider at bedside. 

## 2023-09-01 NOTE — ED Provider Notes (Signed)
[place] [facility]GENCY DEPARTMENT AT [facility] Provider Note   CSN: [id number] Arrival date & time: 09/01/23  1721     History  Chief Complaint  Patient presents with   Abscess    Brittany [name] is a 37 y.o. female.  37 yo F with a chief complaints of a abscess to her right axilla.  This has been going on for about 4 days or so.  Nothing new to the area.  Did shave there prior to this event.  Denies fevers.  Denies prior history of abscess.   Abscess      Home Medications Prior to Admission medications   Medication Sig Start Date End Date Taking? Authorizing Provider  doxycycline (VIBRAMYCIN) 100 MG capsule Take 1 capsule (100 mg total) by mouth 2 (two) times daily. One po bid x 7 days 09/01/23  Yes Melene Plan, DO  amoxicillin (AMOXIL) 500 MG capsule Take 1 capsule (500 mg total) by mouth 3 (three) times daily. 06/12/21   Ocie Doyne, DMD  medroxyPROGESTERone (DEPO-PROVERA) 150 MG/ML injection Inject 150 mg into the muscle every 3 (three) months.    [provider]  methadone (DOLOPHINE) 10 MG/ML solution Take 22 mg by mouth daily. 22mg  (2.37ml) once a day    [provider]  oxyCODONE-acetaminophen (PERCOCET) 5-325 MG tablet Take 1 tablet by mouth every 4 (four) hours as needed. 06/12/21   Ocie Doyne, DMD  potassium chloride SA (K-DUR,KLOR-CON) 20 MEQ tablet Take 1 tablet (20 mEq total) by mouth 2 (two) times daily. 12/08/14 04/02/15  Dione Booze, MD      Allergies    Ibuprofen    Review of Systems   Review of Systems  Physical Exam Updated Vital Signs BP (!) 130/92   Pulse 92   Temp 98.2 F (36.8 C) (Oral)   Resp 20   Ht 5\' 3"  (1.6 m)   Wt 90.7 kg   SpO2 98%   BMI 35.43 kg/m  Physical Exam Vitals and nursing note reviewed.  Constitutional:      General: She is not in acute distress.    Appearance: She is well-developed. She is not diaphoretic.  HENT:     Head: Normocephalic and atraumatic.  Eyes:     Pupils: Pupils are  equal, round, and reactive to light.  Cardiovascular:     Rate and Rhythm: Normal rate and regular rhythm.     Heart sounds: No murmur heard.    No friction rub. No gallop.  Pulmonary:     Effort: Pulmonary effort is normal.     Breath sounds: No wheezing or rales.  Abdominal:     General: There is no distension.     Palpations: Abdomen is soft.     Tenderness: There is no abdominal tenderness.  Musculoskeletal:        General: No tenderness.     Cervical back: Normal range of motion and neck supple.     Comments: Erythematous area with fluctuance and surrounding induration to the right axilla.  Skin:    General: Skin is warm and dry.  Neurological:     Mental Status: She is alert and oriented to person, place, and time.  Psychiatric:        Behavior: Behavior normal.     ED Results / Procedures / Treatments   Labs (all labs ordered are listed, but only abnormal results are displayed) Labs Reviewed - No data to display  EKG None  Radiology No results found.  Procedures .Marland KitchenIncision  and Drainage  Date/Time: 09/01/2023 6:04 PM  Performed by: Melene Plan, DO Authorized by: Melene Plan, DO   Consent:    Consent obtained:  Verbal   Consent given by:  Patient   Risks, benefits, and alternatives were discussed: yes     Risks discussed:  Bleeding, incomplete drainage and infection   Alternatives discussed:  No treatment, delayed treatment and alternative treatment Universal protocol:    Procedure explained and questions answered to patient or proxy's satisfaction: yes     Patient identity confirmed:  Verbally with patient Location:    Type:  Abscess   Size:  Quarter   Location:  Upper extremity   Upper extremity location: axilla. Pre-procedure details:    Skin preparation:  Chlorhexidine Sedation:    Sedation type:  None Anesthesia:    Anesthesia method:  Local infiltration   Local anesthetic:  Lidocaine 2% WITH epi Procedure type:    Complexity:   Complex Procedure details:    Ultrasound guidance: no     Needle aspiration: no     Incision types:  Single straight   Incision depth:  Subcutaneous   Wound management:  Probed and deloculated   Drainage:  Purulent   Drainage amount:  Copious   Wound treatment:  Wound left open   Packing materials:  None Post-procedure details:    Procedure completion:  Tolerated well, no immediate complications     Medications Ordered in ED Medications  lidocaine-EPINEPHrine (XYLOCAINE W/EPI) 2 %-1:200000 (PF) injection 10 mL (10 mLs Intradermal Given 09/01/23 1737)    ED Course/ Medical Decision Making/ A&P                                 Medical Decision Making Risk Prescription drug management.   37 yo F with a chief complaints of an abscess to the right axilla.  I&D performed at bedside.  Will discharge home.  It was quite a bit deeper than expected.  Will start on oral antibiotics.  Warm compresses.  PCP follow-up.  6:05 PM:  I have discussed the diagnosis/risks/treatment options with the patient and family.  Evaluation and diagnostic testing in the emergency department does not suggest an emergent condition requiring admission or immediate intervention beyond what has been performed at this time.  They will follow up with PCP. We also discussed returning to the ED immediately if new or worsening sx occur. We discussed the sx which are most concerning (e.g., sudden worsening pain, fever, inability to tolerate by mouth, rapid spreading) that necessitate immediate return. Medications administered to the patient during their visit and any new prescriptions provided to the patient are listed below.  Medications given during this visit Medications  lidocaine-EPINEPHrine (XYLOCAINE W/EPI) 2 %-1:200000 (PF) injection 10 mL (10 mLs Intradermal Given 09/01/23 1737)     The patient appears reasonably screen and/or stabilized for discharge and I doubt any other medical condition or other [facility]  requiring further screening, evaluation, or treatment in the ED at this time prior to discharge.          Final Clinical Impression(s) / ED Diagnoses Final diagnoses:  Abscess    Rx / DC Orders ED Discharge Orders          Ordered    doxycycline (VIBRAMYCIN) 100 MG capsule  2 times daily        09/01/23 1803  Melene Plan, DO 09/01/23 1805

## 2023-09-01 NOTE — ED Triage Notes (Signed)
Pt caox4, ambulatory c/o abscess to R axilla x3 days. Denies any fever/chills.   Last took Aleve around noon.

## 2023-09-01 NOTE — Discharge Instructions (Signed)
Warm compress is at least 4 times a day.  Please return for rapid spreading redness or if you develop a fever.
